# Patient Record
Sex: Female | Born: 1990
Health system: Southern US, Community
[De-identification: ages and names within clinical notes are randomized; demographics above are authoritative.]

## PROBLEM LIST (undated history)

## (undated) DIAGNOSIS — B009 Herpesviral infection, unspecified: Secondary | ICD-10-CM

## (undated) DIAGNOSIS — F419 Anxiety disorder, unspecified: Secondary | ICD-10-CM

## (undated) DIAGNOSIS — R87619 Unspecified abnormal cytological findings in specimens from cervix uteri: Secondary | ICD-10-CM

## (undated) DIAGNOSIS — D649 Anemia, unspecified: Secondary | ICD-10-CM

## (undated) DIAGNOSIS — S0990XA Unspecified injury of head, initial encounter: Secondary | ICD-10-CM

## (undated) DIAGNOSIS — S83209A Unspecified tear of unspecified meniscus, current injury, unspecified knee, initial encounter: Secondary | ICD-10-CM

## (undated) DIAGNOSIS — A64 Unspecified sexually transmitted disease: Secondary | ICD-10-CM

## (undated) HISTORY — DX: Unspecified injury of head, initial encounter: S09.90XA

## (undated) HISTORY — PX: CHOLECYSTECTOMY: SHX55

## (undated) HISTORY — DX: Herpesviral infection, unspecified: B00.9

## (undated) HISTORY — DX: Unspecified tear of unspecified meniscus, current injury, unspecified knee, initial encounter: S83.209A

## (undated) HISTORY — DX: Anxiety disorder, unspecified: F41.9

## (undated) HISTORY — DX: Anemia, unspecified: D64.9

## (undated) HISTORY — DX: Unspecified abnormal cytological findings in specimens from cervix uteri: R87.619

## (undated) HISTORY — DX: Unspecified sexually transmitted disease: A64

---
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
## 2016-05-25 HISTORY — PX: COLPOSCOPY: SHX161

## 2017-12-29 ENCOUNTER — Encounter: Payer: Self-pay | Admitting: Osteopathic Medicine

## 2017-12-29 ENCOUNTER — Ambulatory Visit (INDEPENDENT_AMBULATORY_CARE_PROVIDER_SITE_OTHER): Payer: 59 | Admitting: Osteopathic Medicine

## 2017-12-29 VITALS — BP 118/72 | HR 75 | Temp 98.4°F | Ht 64.0 in | Wt 217.2 lb

## 2017-12-29 DIAGNOSIS — F419 Anxiety disorder, unspecified: Secondary | ICD-10-CM

## 2017-12-29 DIAGNOSIS — Z113 Encounter for screening for infections with a predominantly sexual mode of transmission: Secondary | ICD-10-CM

## 2017-12-29 DIAGNOSIS — Z8619 Personal history of other infectious and parasitic diseases: Secondary | ICD-10-CM

## 2017-12-29 DIAGNOSIS — Z6837 Body mass index (BMI) 37.0-37.9, adult: Secondary | ICD-10-CM

## 2017-12-29 MED ORDER — PHENTERMINE HCL 37.5 MG PO TABS: 37.5000 mg | ORAL_TABLET | Freq: Every day | ORAL | 0 refills | Status: DC

## 2017-12-29 MED ORDER — ALPRAZOLAM 0.5 MG PO TABS: 0.2500 mg | ORAL_TABLET | Freq: Two times a day (BID) | ORAL | 0 refills | Status: DC | PRN

## 2017-12-29 NOTE — Progress Notes (Signed)
HPI: Theresa Wheeler is a 27 y.o. female who  has a past medical history of HSV-1 (herpes simplex virus 1) infection.  she presents to [facility] today, 12/30/17,  for chief complaint of: New to establish care Anxiety Obesity History of cold sores  Very pleasant new patient here to establish care.  Works as a Engineer, civil (consulting) in the [employer].  Reports significant anxiety episodes intermittently, may be a few times per week or a few times per month.  Typically revolve around possible exposure to blood and bodily fluids causing infection such as HIV or hepatitis.  She recognizes these are not particularly rational.  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  Previously on phentermine about a year ago for a few months.  Managed to lose about 20 to 30 pounds on this and keep it off, would like to repeat a round of this treatment.     Past medical, surgical, social and family history reviewed:  There are no active problems to display for this patient.   Past Surgical History:  Procedure Laterality Date  . BREAST REDUCTION SURGERY  2011  . CHOLECYSTECTOMY      Social History   Tobacco Use  . Smoking status: Never Smoker  . Smokeless tobacco: Never Used  Substance Use Topics  . Alcohol use: Yes    Comment: 3-4/week    Family History  Problem Relation Age of Onset  . Skin cancer Mother   . High blood pressure Father   . Diabetes Maternal Grandmother   . Stroke Maternal Grandmother   . Diabetes Maternal Grandfather   . Stroke Maternal Grandfather      Current medication list and allergy/intolerance information reviewed:    Current Outpatient Medications  Medication Sig Dispense Refill  . valACYclovir (VALTREX) 1000 MG tablet Take 1,000 mg by mouth daily.  9  .   30 tablet 0  .   30 tablet 0   No  current facility-administered medications for this visit.     No Known Allergies    Review of Systems:  Constitutional:  No  fever, no chills, No recent illness, No unintentional weight changes. No significant fatigue.   HEENT: No  headache, no vision change, no hearing change, No sore throat, No  sinus pressure  Cardiac: No  chest pain, No  pressure, No palpitations, No  Orthopnea  Respiratory:  No  shortness of breath. No  Cough  Gastrointestinal: No  abdominal pain, No  nausea, No  vomiting,  No  blood in stool, No  diarrhea, No  constipation   Musculoskeletal: No new myalgia/arthralgia  Skin: No  Rash, No other wounds/concerning lesions  Genitourinary: No  incontinence, No  abnormal genital bleeding, No abnormal genital discharge  Hem/Onc: No  easy bruising/bleeding, No  abnormal lymph node  Endocrine: No cold intolerance,  No heat intolerance. No polyuria/polydipsia/polyphagia   Neurologic: No  weakness, No  dizziness, No  slurred speech/focal weakness/facial droop  Psychiatric: No  concerns with depression, +concerns with anxiety, No sleep problems, No mood problems   Exam:  BP 118/72 (BP Location: Left Arm, Patient Position: Sitting, Cuff Size: Large)   Pulse 75   Temp 98.4 F (36.9 C) (Oral)   Ht 5\' 4"  (1.626 m)   Wt 217 lb 3.2 oz (98.5 kg)   LMP 12/19/2017   BMI 37.28 kg/m  Constitutional: VS see above. General Appearance: alert, well-developed, well-nourished, NAD  Eyes: Normal lids and conjunctive, non-icteric sclera  Ears, Nose, Mouth, Throat: MMM, Normal external inspection ears/nares/mouth/lips/gums. TM normal bilaterally. Pharynx/tonsils no erythema, no exudate. Nasal mucosa normal.   Neck: No masses, trachea midline. No thyroid enlargement. No tenderness/mass appreciated. No lymphadenopathy  Respiratory: Normal respiratory effort. no wheeze, no rhonchi, no rales  Cardiovascular: S1/S2 normal, no murmur, no rub/gallop auscultated. RRR. No lower  extremity edema.  Gastrointestinal: Nontender, no masses. No hepatomegaly, no splenomegaly.   Musculoskeletal: Gait normal. No clubbing/cyanosis of digits.   Neurological: Normal balance/coordination. No tremor. No cranial nerve deficit on limited exam. Motor and sensation intact and symmetric. Cerebellar reflexes intact.   Skin: warm, dry, intact. No rash/ulcer. No concerning nevi or subq nodules on limited exam.  Psychiatric: Normal judgment/insight. Normal mood and affect. Oriented x3.    Results for orders placed or performed in visit on 12/29/17 (from the past 72 hour(s))  CBC     Status: None   Collection Time: 12/29/17  3:08 PM  Result Value Ref Range   WBC 8.0 3.8 - 10.8 Thousand/uL   RBC 4.76 3.80 - 5.10 Million/uL   Hemoglobin 12.9 11.7 - 15.5 g/dL   HCT 09.838.5 11.935.0 - 14.745.0 %   MCV 80.9 80.0 - 100.0 fL   MCH 27.1 27.0 - 33.0 pg   MCHC 33.5 32.0 - 36.0 g/dL   RDW 82.913.0 56.211.0 - 13.015.0 %   Platelets 360 140 - 400 Thousand/uL   MPV 9.2 7.5 - 12.5 fL  COMPLETE METABOLIC PANEL WITH GFR     Status: None   Collection Time: 12/29/17  3:08 PM  Result Value Ref Range   Glucose, Bld 88 65 - 99 mg/dL    Comment: .            Fasting reference interval .    BUN 11 7 - 25 mg/dL   Creat 8.650.86 7.840.50 - 6.961.10 mg/dL   GFR, Est Non African American 93 > OR = 60 mL/min/1.1273m2   GFR, Est African American 108 > OR = 60 mL/min/1.10273m2   BUN/Creatinine Ratio NOT APPLICABLE 6 - 22 (calc)   Sodium 138 135 - 146 mmol/L   Potassium 4.2 3.5 - 5.3 mmol/L   Chloride 104 98 - 110 mmol/L   CO2 23 20 - 32 mmol/L   Calcium 9.6 8.6 - 10.2 mg/dL   Total Protein 7.8 6.1 - 8.1 g/dL   Albumin 4.6 3.6 - 5.1 g/dL   Globulin 3.2 1.9 - 3.7 g/dL (calc)   AG Ratio 1.4 1.0 - 2.5 (calc)   Total Bilirubin 0.5 0.2 - 1.2 mg/dL   Alkaline phosphatase (APISO) 64 33 - 115 U/L   AST 15 10 - 30 U/L   ALT 15 6 - 29 U/L  Lipid panel     Status: Abnormal   Collection Time: 12/29/17  3:08 PM  Result Value Ref Range    Cholesterol 216 (H) <200 mg/dL   HDL 56 >29>50 mg/dL   Triglycerides 528145 <413<150 mg/dL   LDL Cholesterol (Calc) 133 (H) mg/dL (calc)    Comment: Reference range: <100 . Desirable range <100 mg/dL for primary prevention;   <70 mg/dL for patients with CHD or diabetic patients  with > or = 2 CHD risk factors. Marland Kitchen. LDL-C is now calculated using the Martin-Hopkins  calculation, which is a validated novel method providing  better accuracy than the Friedewald equation in the  estimation of LDL-C.  Horald Pollen et al. Lenox Ahr. 1610;960(45): 2061-2068  (http://education.[facility].com/faq/FAQ164)    Total CHOL/HDL Ratio 3.9 <5.0 (calc)   Non-HDL Cholesterol (Calc) 160 (H) <130 mg/dL (calc)    Comment: For patients with diabetes plus 1 major ASCVD risk  factor, treating to a non-HDL-C goal of <100 mg/dL  (LDL-C of <40 mg/dL) is considered a therapeutic  option.   TSH     Status: None   Collection Time: 12/29/17  3:08 PM  Result Value Ref Range   TSH 2.71 mIU/L    Comment:           Reference Range .           > or = 20 Years  0.40-4.50 .                Pregnancy Ranges           First trimester    0.26-2.66           Second trimester   0.55-2.73           Third trimester    0.43-2.91   RPR     Status: None   Collection Time: 12/29/17  3:08 PM  Result Value Ref Range   RPR Ser Ql NON-REACTIVE NON-REACTI    No results found.   ASSESSMENT/PLAN: The primary encounter diagnosis was Routine screening for STI (sexually transmitted infection). Diagnoses of Anxiety, BMI 37.0-37.9, adult, and History of PCR DNA positive for HSV1 were also pertinent to this visit.   Okay to trial alprazolam as needed for sparing use.  Okay to prescribe 2 to 3 months of phentermine, as long as no concerning side effects.  Patient will follow-up for weight check.   Orders Placed This Encounter  Procedures  . CBC  . COMPLETE METABOLIC PANEL WITH GFR  . Lipid panel  . TSH  . HIV antibody  . Hepatitis B  surface antibody,qualitative  . Hepatitis B core antibody, total  . RPR  . Hepatitis C antibody   Meds ordered this encounter  Medications  . DISCONTD: ALPRAZolam (XANAX) 0.5 MG tablet    Sig: Take 0.5-1 tablets (0.25-0.5 mg total) by mouth 2 (two) times daily as needed for anxiety.    Dispense:  30 tablet    Refill:  0  . phentermine (ADIPEX-P) 37.5 MG tablet    Sig: Take 1 tablet (37.5 mg total) by mouth daily before breakfast.    Dispense:  30 tablet    Refill:  0  . ALPRAZolam (XANAX) 0.5 MG tablet    Sig: Take 0.5-1 tablets (0.25-0.5 mg total) by mouth 2 (two) times daily as needed for anxiety.    Dispense:  30 tablet    Refill:  0        Visit summary with medication list and pertinent instructions was printed for patient to review. All questions at time of visit were answered - patient instructed to contact office with any additional concerns. ER/RTC precautions were reviewed with the patient.   Follow-up plan: Return in about 1 month (around 01/26/2018) for weight check and BP check w nurse visit - sooner if needed .    Please note: voice recognition software was used to produce this document, and typos may escape review. Please contact Dr. Lyn Hollingshead for any needed clarifications.

## 2017-12-30 ENCOUNTER — Encounter: Payer: Self-pay | Admitting: Osteopathic Medicine

## 2017-12-30 DIAGNOSIS — Z8619 Personal history of other infectious and parasitic diseases: Secondary | ICD-10-CM | POA: Insufficient documentation

## 2017-12-30 DIAGNOSIS — Z6837 Body mass index (BMI) 37.0-37.9, adult: Secondary | ICD-10-CM | POA: Insufficient documentation

## 2017-12-30 DIAGNOSIS — F419 Anxiety disorder, unspecified: Secondary | ICD-10-CM | POA: Insufficient documentation

## 2017-12-30 LAB — COMPLETE METABOLIC PANEL WITH GFR
AG RATIO: 1.4 (calc) (ref 1.0–2.5)
ALT: 15 U/L (ref 6–29)
AST: 15 U/L (ref 10–30)
Albumin: 4.6 g/dL (ref 3.6–5.1)
Alkaline phosphatase (APISO): 64 U/L (ref 33–115)
BILIRUBIN TOTAL: 0.5 mg/dL (ref 0.2–1.2)
BUN: 11 mg/dL (ref 7–25)
CALCIUM: 9.6 mg/dL (ref 8.6–10.2)
CO2: 23 mmol/L (ref 20–32)
Chloride: 104 mmol/L (ref 98–110)
Creat: 0.86 mg/dL (ref 0.50–1.10)
GFR, Est African American: 108 mL/min/{1.73_m2} (ref >=60)
GFR, Est Non African American: 93 mL/min/{1.73_m2} (ref >=60)
Globulin: 3.2 g/dL (calc) (ref 1.9–3.7)
Glucose, Bld: 88 mg/dL (ref 65–99)
POTASSIUM: 4.2 mmol/L (ref 3.5–5.3)
Sodium: 138 mmol/L (ref 135–146)
Total Protein: 7.8 g/dL (ref 6.1–8.1)

## 2017-12-30 LAB — LIPID PANEL
Cholesterol: 216 mg/dL — ABNORMAL HIGH (ref <200)
HDL: 56 mg/dL (ref >50)
LDL Cholesterol (Calc): 133 mg/dL (calc) — ABNORMAL HIGH
Non-HDL Cholesterol (Calc): 160 mg/dL (calc) — ABNORMAL HIGH (ref <130)
TRIGLYCERIDES: 145 mg/dL (ref <150)
Total CHOL/HDL Ratio: 3.9 (calc) (ref <5.0)

## 2017-12-30 LAB — HEPATITIS B SURFACE ANTIBODY,QUALITATIVE: Hep B S Ab: REACTIVE — AB

## 2017-12-30 LAB — HEPATITIS B CORE ANTIBODY, TOTAL: HEP B C TOTAL AB: NONREACTIVE

## 2017-12-30 LAB — HEPATITIS C ANTIBODY
Hepatitis C Ab: NONREACTIVE
SIGNAL TO CUT-OFF: 0.03 (ref <1.00)

## 2017-12-30 LAB — CBC
HCT: 38.5 % (ref 35.0–45.0)
Hemoglobin: 12.9 g/dL (ref 11.7–15.5)
MCH: 27.1 pg (ref 27.0–33.0)
MCHC: 33.5 g/dL (ref 32.0–36.0)
MCV: 80.9 fL (ref 80.0–100.0)
MPV: 9.2 fL (ref 7.5–12.5)
PLATELETS: 360 10*3/uL (ref 140–400)
RBC: 4.76 10*6/uL (ref 3.80–5.10)
RDW: 13 % (ref 11.0–15.0)
WBC: 8 10*3/uL (ref 3.8–10.8)

## 2017-12-30 LAB — RPR: RPR: NONREACTIVE

## 2017-12-30 LAB — HIV ANTIBODY (ROUTINE TESTING W REFLEX): HIV 1&2 Ab, 4th Generation: NONREACTIVE

## 2017-12-30 LAB — TSH: TSH: 2.71 mIU/L

## 2018-02-01 ENCOUNTER — Ambulatory Visit (INDEPENDENT_AMBULATORY_CARE_PROVIDER_SITE_OTHER): Payer: 59 | Admitting: Osteopathic Medicine

## 2018-02-01 DIAGNOSIS — R635 Abnormal weight gain: Secondary | ICD-10-CM

## 2018-02-01 DIAGNOSIS — Z6837 Body mass index (BMI) 37.0-37.9, adult: Secondary | ICD-10-CM | POA: Diagnosis not present

## 2018-02-01 MED ORDER — PHENTERMINE HCL 37.5 MG PO TABS: 37.5000 mg | ORAL_TABLET | Freq: Every day | ORAL | 0 refills | Status: DC

## 2018-02-01 NOTE — Assessment & Plan Note (Signed)
Phentermine Rx 12/29/17 OK to refill today 02/01/18  Wt Readings from Last 3 Encounters:  02/01/18 196 lb (88.9 kg)  12/29/17 217 lb 3.2 oz (98.5 kg)

## 2018-02-01 NOTE — Progress Notes (Signed)
   Subjective:    Patient ID: Theresa Wheeler, female    DOB: 1991-05-22, 27 y.o.   MRN: [id number]  HPI  Theresa Wheeler is here for blood pressure and weight check. Diet and exercise is going well. Denies trouble sleeping or palpitations.   Review of Systems     Objective:   Physical Exam        Assessment & Plan:  Abnormal weight gain - Patient has lost weight. A refill of phentermine sent to pharmacy. Patient advised to schedule a follow up in 4 weeks.

## 2018-02-01 NOTE — Progress Notes (Signed)
Theresa Wheeler was seen today for weight check.  Diagnoses and all orders for this visit:  BMI 37.0-37.9, adult   Phentermine Rx 12/29/17 OK to refill today 02/01/18  Wt Readings from Last 3 Encounters:  02/01/18 196 lb (88.9 kg)  12/29/17 217 lb 3.2 oz (98.5 kg)

## 2018-02-11 ENCOUNTER — Telehealth: Payer: Self-pay

## 2018-02-11 DIAGNOSIS — Z8619 Personal history of other infectious and parasitic diseases: Secondary | ICD-10-CM

## 2018-02-11 NOTE — Telephone Encounter (Signed)
Requesting RF on Valacyclovir.   Pt states she usually gets this from her GYN but she has not been seen there recently and will need an appt for a refill. Does not appear this has ever been written by Dr Lyn HollingsheadAlexander before   I advised pt that Dr Lyn HollingsheadAlexander out of office and RF probably wont be sent in by Monday, but that I would send a phone note and see if she was okay to refill

## 2018-02-12 MED ORDER — VALACYCLOVIR HCL 1 G PO TABS: 1000.0000 mg | ORAL_TABLET | Freq: Every day | ORAL | 3 refills | Status: DC

## 2018-02-12 NOTE — Telephone Encounter (Signed)
I went ahead and refill the medication

## 2018-02-14 NOTE — Telephone Encounter (Signed)
Left VM for Pt with status update.  

## 2018-02-28 ENCOUNTER — Ambulatory Visit: Payer: 59

## 2018-05-04 ENCOUNTER — Telehealth: Payer: Self-pay

## 2018-05-04 NOTE — Telephone Encounter (Signed)
Pt called requesting med refill for xanax. If appropriate, pls send rx to [facility]. Thanks.

## 2018-05-05 ENCOUNTER — Ambulatory Visit (INDEPENDENT_AMBULATORY_CARE_PROVIDER_SITE_OTHER): Payer: 59 | Admitting: Osteopathic Medicine

## 2018-05-05 VITALS — BP 129/78 | HR 73 | Temp 97.4°F | Wt 183.5 lb

## 2018-05-05 DIAGNOSIS — Z6837 Body mass index (BMI) 37.0-37.9, adult: Secondary | ICD-10-CM

## 2018-05-05 MED ORDER — ALPRAZOLAM 0.5 MG PO TABS: 0.2500 mg | ORAL_TABLET | Freq: Two times a day (BID) | ORAL | 0 refills | Status: DC | PRN

## 2018-05-05 MED ORDER — PHENTERMINE HCL 37.5 MG PO TABS: 37.5000 mg | ORAL_TABLET | Freq: Every day | ORAL | 0 refills | Status: DC

## 2018-05-05 MED FILL — PHENTERMINE 37.5 MG TABLET: 37.5 | 30 days supply | Qty: 30 | Fill #0

## 2018-05-05 MED FILL — ALPRAZolam 0.5 MG TABS: 0.5 | 15 days supply | Qty: 30 | Fill #0

## 2018-05-05 NOTE — Telephone Encounter (Signed)
Already sent.

## 2018-05-05 NOTE — Progress Notes (Signed)
Pt here today for weight and BP check. Previous weight was 196 lb, todays weight 183.5 lb. Her BP was 129/78. Pt is requesting we switch her pharmacy to [facility], she also needs Xanax refilled if possible.

## 2018-05-06 NOTE — Telephone Encounter (Signed)
Left a detailed vm msg for pt regarding med refill. Call back info provided.  

## 2018-05-27 ENCOUNTER — Telehealth: Payer: Self-pay

## 2018-05-27 MED ORDER — FLUCONAZOLE 150 MG PO TABS: 150.0000 mg | ORAL_TABLET | Freq: Once | ORAL | 0 refills | Status: AC

## 2018-05-27 NOTE — Telephone Encounter (Signed)
Theresa Wheeler complains of white vaginal discharge and vaginal itching. Denies fever, chills, sweats or pelvic pain. She has tried OTC medications without relief. She states she needs 2 diflucan. Please advise.

## 2018-05-27 NOTE — Telephone Encounter (Signed)
Sent to the pharmacy. If not improving please see PcP for office visit.

## 2018-05-30 NOTE — Telephone Encounter (Signed)
Left VM with update.  

## 2018-06-16 MED FILL — valACYclovir HCL 1 GM TABS: 1 | 90 days supply | Qty: 90 | Fill #0

## 2018-06-30 ENCOUNTER — Other Ambulatory Visit: Payer: Self-pay

## 2018-06-30 ENCOUNTER — Emergency Department (INDEPENDENT_AMBULATORY_CARE_PROVIDER_SITE_OTHER): Payer: 59

## 2018-06-30 ENCOUNTER — Emergency Department
Admission: EM | Admit: 2018-06-30 | Discharge: 2018-06-30 | Disposition: A | Discharge status: Home / Self Care | Payer: 59 | Source: Home / Self Care | Attending: Family Medicine | Admitting: Family Medicine

## 2018-06-30 DIAGNOSIS — S20211A Contusion of right front wall of thorax, initial encounter: Secondary | ICD-10-CM

## 2018-06-30 DIAGNOSIS — Z3200 Encounter for pregnancy test, result unknown: Secondary | ICD-10-CM | POA: Diagnosis not present

## 2018-06-30 DIAGNOSIS — M542 Cervicalgia: Secondary | ICD-10-CM | POA: Diagnosis not present

## 2018-06-30 DIAGNOSIS — S20312A Abrasion of left front wall of thorax, initial encounter: Secondary | ICD-10-CM

## 2018-06-30 DIAGNOSIS — S161XXA Strain of muscle, fascia and tendon at neck level, initial encounter: Secondary | ICD-10-CM

## 2018-06-30 DIAGNOSIS — S299XXA Unspecified injury of thorax, initial encounter: Secondary | ICD-10-CM | POA: Diagnosis not present

## 2018-06-30 DIAGNOSIS — R072 Precordial pain: Secondary | ICD-10-CM | POA: Diagnosis not present

## 2018-06-30 DIAGNOSIS — R079 Chest pain, unspecified: Secondary | ICD-10-CM

## 2018-06-30 DIAGNOSIS — S199XXA Unspecified injury of neck, initial encounter: Secondary | ICD-10-CM | POA: Diagnosis not present

## 2018-06-30 LAB — POCT URINALYSIS DIP (MANUAL ENTRY)
Bilirubin, UA: NEGATIVE
Blood, UA: NEGATIVE
Glucose, UA: NEGATIVE mg/dL
Ketones, POC UA: NEGATIVE mg/dL
Leukocytes, UA: NEGATIVE
Nitrite, UA: NEGATIVE
PROTEIN UA: NEGATIVE mg/dL
Spec Grav, UA: 1.01 (ref 1.010–1.025)
Urobilinogen, UA: 0.2 E.U./dL
pH, UA: 5.5 (ref 5.0–8.0)

## 2018-06-30 LAB — POCT URINE PREGNANCY: Preg Test, Ur: NEGATIVE

## 2018-06-30 MED ORDER — CYCLOBENZAPRINE HCL 10 MG PO TABS: 10.0000 mg | ORAL_TABLET | Freq: Three times a day (TID) | ORAL | 1 refills | Status: DC | PRN

## 2018-06-30 MED ORDER — HYDROCODONE-ACETAMINOPHEN 5-325 MG PO TABS: ORAL_TABLET | ORAL | 0 refills | Status: DC

## 2018-06-30 MED ORDER — MUPIROCIN 2 % EX OINT: 1.0000 "application " | TOPICAL_OINTMENT | Freq: Three times a day (TID) | CUTANEOUS | 0 refills | Status: DC

## 2018-06-30 NOTE — Discharge Instructions (Signed)
Apply ice pack for 20 to 30 minutes, 3 to 4 times daily  Continue until pain and swelling decrease.  Continue Ibuprofen 200mg , 4 tabs every 8 hours with food.   May take acetaminophen 2 to 3 times daily as needed.  Begin range of motion and stretching exercises in about 5 days as tolerated. May return for cervical collar if neck pain worsens.
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
## 2018-07-03 ENCOUNTER — Telehealth: Payer: Self-pay | Admitting: Emergency Medicine

## 2018-07-03 NOTE — Telephone Encounter (Signed)
Spoke with patient states that she is doing well.  Will follow up as needed.

## 2018-08-18 ENCOUNTER — Encounter: Payer: Self-pay | Admitting: Osteopathic Medicine

## 2018-08-18 ENCOUNTER — Other Ambulatory Visit: Payer: Self-pay

## 2018-08-18 ENCOUNTER — Ambulatory Visit (INDEPENDENT_AMBULATORY_CARE_PROVIDER_SITE_OTHER): Payer: 59 | Admitting: Osteopathic Medicine

## 2018-08-18 VITALS — BP 120/74 | HR 98 | Temp 100.0°F | Wt 182.0 lb

## 2018-08-18 DIAGNOSIS — R509 Fever, unspecified: Secondary | ICD-10-CM

## 2018-08-18 MED ORDER — ALPRAZOLAM 0.5 MG PO TABS: 0.2500 mg | ORAL_TABLET | Freq: Two times a day (BID) | ORAL | 0 refills | Status: DC | PRN

## 2018-08-18 MED ORDER — AMOXICILLIN 500 MG PO TABS: 500.0000 mg | ORAL_TABLET | Freq: Two times a day (BID) | ORAL | 0 refills | Status: AC

## 2018-08-18 NOTE — Patient Instructions (Addendum)
Medications & Home Remedies for Upper Respiratory Illness   Aches/Pains, Fever, Headache OTC Acetaminophen (Tylenol) 500 mg tablets - take max 2 tablets (1000 mg) every 6 hours (4 times per day)    Sinus Congestion OTC Nasal Saline if desired to rinse OTC Oxymetolazone (Afrin, others) sparing use due to rebound congestion, NEVER use in kids OTC Phenylephrine (Sudafed) 10 mg tablets every 4 hours (or the 12-hour formulation)* OTC Diphenhydramine (Benadryl) 25 mg tablets - take max 2 tablets every 4 hours   Cough & Sore Throat OTC Dextromethorphan (Robitussin, others) - cough suppressant OTC Guaifenesin (Robitussin, Mucinex, others) - expectorant (helps cough up mucus) (Dextromethorphan and Guaifenesin also come in a combination tablet/syrup) OTC Lozenges w/ Benzocaine + Menthol (Cepacol) Honey - as much as you want! Teas which "coat the throat" - look for ingredients Elm Bark, Licorice Root, Marshmallow Root   Other OTC Zinc Lozenges within 24 hours of symptoms onset - mixed evidence this shortens the duration of the common cold Don't waste your money on Vitamin C or Echinacea in acute illness - it's already too late!         [name]  Location: [address] [place] [place] [address]   Infection Prevention Recommendations for Individuals Confirmed to have, or Being Evaluated for, 2019 Novel Coronavirus (COVID-19) Infection Who Receive Care at Home  Individuals who are confirmed to have, or are being evaluated for, COVID-19 should follow the prevention steps below until a healthcare provider or local or state health department says they can return to normal activities.  Stay home except to get medical care You should restrict activities outside your home, except for getting medical care. Do not go to work, school, or public areas, and do not use public transportation or taxis.  Call ahead before visiting your  doctor Before your medical appointment, call the healthcare provider and tell them that you have, or are being evaluated for, COVID-19 infection. This will help the healthcare provider's office take steps to keep other people from getting infected. Ask your healthcare provider to call the local or state health department.  Monitor your symptoms Seek prompt medical attention if your illness is worsening (e.g., difficulty breathing). Before going to your medical appointment, call the healthcare provider and tell them that you have, or are being evaluated for, COVID-19 infection. Ask your healthcare provider to call the local or state health department.  Wear a facemask You should wear a facemask that covers your nose and mouth when you are in the same room with other people and when you visit a healthcare provider. People who live with or visit you should also wear a facemask while they are in the same room with you.  Separate yourself from other people in your home As much as possible, you should stay in a different room from other people in your home. Also, you should use a separate bathroom, if available.  Avoid sharing household items You should not share dishes, drinking glasses, cups, eating utensils, towels, bedding, or other items with other people in your home. After using these items, you should wash them thoroughly with soap and water.  Cover your coughs and sneezes Cover your mouth and nose with a tissue when you cough or sneeze, or you can cough or sneeze into your sleeve. Throw used tissues in a lined trash can, and immediately wash your hands with soap and water for at least 20 seconds or  use an alcohol-based hand rub.  Wash your [employer] your hands often and thoroughly with soap and water for at least 20 seconds. You can use an alcohol-based hand sanitizer if soap and water are not available and if your hands are not visibly dirty. Avoid touching your eyes, nose,  and mouth with unwashed hands.   Prevention Steps for Caregivers and Household Members of Individuals Confirmed to have, or Being Evaluated for, COVID-19 Infection Being Cared for in the Home  If you live with, or provide care at home for, a person confirmed to have, or being evaluated for, COVID-19 infection please follow these guidelines to prevent infection:  Follow healthcare provider's instructions Make sure that you understand and can help the patient follow any healthcare provider instructions for all care.  Provide for the patient's basic needs You should help the patient with basic needs in the home and provide support for getting groceries, prescriptions, and other personal needs.  Monitor the patient's symptoms If they are getting sicker, call his or her medical provider and tell them that the patient has, or is being evaluated for, COVID-19 infection. This will help the healthcare provider's office take steps to keep other people from getting infected. Ask the healthcare provider to call the local or state health department.  Limit the number of people who have contact with the patient  If possible, have only one caregiver for the patient.  Other household members should stay in another home or place of residence. If this is not possible, they should stay  in another room, or be separated from the patient as much as possible. Use a separate bathroom, if available.  Restrict visitors who do not have an essential need to be in the home.  Keep older adults, very young children, and other sick people away from the patient Keep older adults, very young children, and those who have compromised immune systems or chronic health conditions away from the patient. This includes people with chronic heart, lung, or kidney conditions, diabetes, and cancer.  Ensure good ventilation Make sure that shared spaces in the home have good air flow, such as from an air conditioner or an  opened window, weather permitting.  Wash your hands often  Wash your hands often and thoroughly with soap and water for at least 20 seconds. You can use an alcohol based hand sanitizer if soap and water are not available and if your hands are not visibly dirty.  Avoid touching your eyes, nose, and mouth with unwashed hands.  Use disposable paper towels to dry your hands. If not available, use dedicated cloth towels and replace them when they become wet.  Wear a facemask and gloves  Wear a disposable facemask at all times in the room and gloves when you touch or have contact with the patient's blood, body fluids, and/or secretions or excretions, such as sweat, saliva, sputum, nasal mucus, vomit, urine, or feces.  Ensure the mask fits over your nose and mouth tightly, and do not touch it during use.  Throw out disposable facemasks and gloves after using them. Do not reuse.  Wash your hands immediately after removing your facemask and gloves.  If your personal clothing becomes contaminated, carefully remove clothing and launder. Wash your hands after handling contaminated clothing.  Place all used disposable facemasks, gloves, and other waste in a lined container before disposing them with other household waste.  Remove gloves and wash your hands immediately after handling these items.  Do not share  dishes, glasses, or other household items with the patient  Avoid sharing household items. You should not share dishes, drinking glasses, cups, eating utensils, towels, bedding, or other items with a patient who is confirmed to have, or being evaluated for, COVID-19 infection.  After the person uses these items, you should wash them thoroughly with soap and water.  Wash laundry thoroughly  Immediately remove and wash clothes or bedding that have blood, body fluids, and/or secretions or excretions, such as sweat, saliva, sputum, nasal mucus, vomit, urine, or feces, on them.  Wear gloves  when handling laundry from the patient.  Read and follow directions on labels of laundry or clothing items and detergent. In general, wash and dry with the warmest temperatures recommended on the label.  Clean all areas the individual has used often  Clean all touchable surfaces, such as counters, tabletops, doorknobs, bathroom fixtures, toilets, phones, keyboards, tablets, and bedside tables, every day. Also, clean any surfaces that may have blood, body fluids, and/or secretions or excretions on them.  Wear gloves when cleaning surfaces the patient has come in contact with.  Use a diluted bleach solution (e.g., dilute bleach with 1 part bleach and 10 parts water) or a household disinfectant with a label that says EPA-registered for coronaviruses. To make a bleach solution at home, add 1 tablespoon of bleach to 1 quart (4 cups) of water. For a larger supply, add  cup of bleach to 1 gallon (16 cups) of water.  Read labels of cleaning products and follow recommendations provided on product labels. Labels contain instructions for safe and effective use of the cleaning product including precautions you should take when applying the product, such as wearing gloves or eye protection and making sure you have good ventilation during use of the product.  Remove gloves and wash hands immediately after cleaning.  Monitor yourself for signs and symptoms of illness Caregivers and household members are considered close contacts, should monitor their health, and will be asked to limit movement outside of the home to the extent possible. Follow the monitoring steps for close contacts listed on the symptom monitoring form.   ? If you have additional questions, contact your local health department or call the epidemiologist on call at [phone] (available 24/7). ? This guidance is subject to change. For the most up-to-date guidance from [facility], please refer to their  website: [email or web address]

## 2018-08-18 NOTE — Progress Notes (Signed)
Virtual Visit  via Video or Phone Note  I connected with      Theresa Wheeler on 08/18/18 at 3:00 by a telemedicine application and verified that I am speaking with the correct person using two identifiers.   I discussed the limitations of evaluation and management by telemedicine and the availability of in person appointments. The patient expressed understanding and agreed to proceed.  History of Present Illness: Theresa Wheeler is a 28 y.o. female who would like to discuss fever.   Onset 2 days ago, was sent home from work yesterday. Took Tylenol 1000 mg when temp as high as 104  Was 101.9 when got home from work, this was 5 hours from last Tylenol. Mild scratchy/sore throat but not severe, feels more dry than painful. She has no lymphadenopathy. No cough. No sinus pressure. No recent travel. 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       Observations/Objective: BP 120/74 (Patient Position: Sitting)   Pulse 98   Temp 100 F (37.8 C) (Oral)   Wt 182 lb (82.6 kg)   BMI 31.24 kg/m  BP Readings from Last 3 Encounters:  08/18/18 120/74  06/30/18 131/86  05/05/18 129/78   Exam: Normal Speech. Normal affect.    Lab and Radiology Results No results found for this or any previous visit (from the past 72 hour(s)). No results found.     Assessment and Plan: 28 y.o. female with The encounter diagnosis was Febrile illness, acute.  Advised self-quarantine according to CDC guidelines. Check CDC website for full details.   PDMP not reviewed this encounter. No orders of the defined types were placed in this encounter.  Meds ordered this encounter  Medications  . amoxicillin (AMOXIL) 500 MG tablet    Sig: Take 1 tablet (500 mg total) by mouth 2 (two) times daily for 10 days. If needed for strep throat    Dispense:  20 tablet    Refill:  0  . ALPRAZolam (XANAX) 0.5 MG tablet    Sig: Take 0.5-1 tablets (0.25-0.5 mg total) by mouth 2 (two) times  daily as needed for anxiety.    Dispense:  30 tablet    Refill:  0   Patient Instructions     Medications & Home Remedies for Upper Respiratory Illness   Aches/Pains, Fever, Headache OTC Acetaminophen (Tylenol) 500 mg tablets - take max 2 tablets (1000 mg) every 6 hours (4 times per day)    Sinus Congestion OTC Nasal Saline if desired to rinse OTC Oxymetolazone (Afrin, others) sparing use due to rebound congestion, NEVER use in kids OTC Phenylephrine (Sudafed) 10 mg tablets every 4 hours (or the 12-hour formulation)* OTC Diphenhydramine (Benadryl) 25 mg tablets - take max 2 tablets every 4 hours   Cough & Sore Throat OTC Dextromethorphan (Robitussin, others) - cough suppressant OTC Guaifenesin (Robitussin, Mucinex, others) - expectorant (helps cough up mucus) (Dextromethorphan and Guaifenesin also come in a combination tablet/syrup) OTC Lozenges w/ Benzocaine + Menthol (Cepacol) Honey - as much as you want! Teas which "coat the throat" - look for ingredients Elm Bark, Licorice Root, Marshmallow Root   Other OTC Zinc Lozenges within 24 hours of symptoms onset - mixed evidence this shortens the duration of the common cold Don't waste your money on Vitamin C or Echinacea in acute illness - it's already too late!         [name]  Location: [facility]  [address] [place] [address]   Infection Prevention Recommendations for Individuals Confirmed to have, or Being Evaluated for, 2019 Novel Coronavirus (COVID-19) Infection Who Receive Care at Home  Individuals who are confirmed to have, or are being evaluated for, COVID-19 should follow the prevention steps below until a healthcare provider or local or state health department says they can return to normal activities.  Stay home except to get medical care You should restrict activities outside your home, except for getting medical care. Do not go to work, school, or public  areas, and do not use public transportation or taxis.  Call ahead before visiting your doctor Before your medical appointment, call the healthcare provider and tell them that you have, or are being evaluated for, COVID-19 infection. This will help the healthcare provider's office take steps to keep other people from getting infected. Ask your healthcare provider to call the local or state health department.  Monitor your symptoms Seek prompt medical attention if your illness is worsening (e.g., difficulty breathing). Before going to your medical appointment, call the healthcare provider and tell them that you have, or are being evaluated for, COVID-19 infection. Ask your healthcare provider to call the local or state health department.  Wear a facemask You should wear a facemask that covers your nose and mouth when you are in the same room with other people and when you visit a healthcare provider. People who live with or visit you should also wear a facemask while they are in the same room with you.  Separate yourself from other people in your home As much as possible, you should stay in a different room from other people in your home. Also, you should use a separate bathroom, if available.  Avoid sharing household items You should not share dishes, drinking glasses, cups, eating utensils, towels, bedding, or other items with other people in your home. After using these items, you should wash them thoroughly with soap and water.  Cover your coughs and sneezes Cover your mouth and nose with a tissue when you cough or sneeze, or you can cough or sneeze into your sleeve. Throw used tissues in a lined trash can, and immediately wash your hands with soap and water for at least 20 seconds or use an alcohol-based hand rub.  Wash your [employer] your hands often and thoroughly with soap and water for at least 20 seconds. You can use an alcohol-based hand sanitizer if soap and water are not  available and if your hands are not visibly dirty. Avoid touching your eyes, nose, and mouth with unwashed hands.   Prevention Steps for Caregivers and Household Members of Individuals Confirmed to have, or Being Evaluated for, COVID-19 Infection Being Cared for in the Home  If you live with, or provide care at home for, a person confirmed to have, or being evaluated for, COVID-19 infection please follow these guidelines to prevent infection:  Follow healthcare provider's instructions Make sure that you understand and can help the patient follow any healthcare provider instructions for all care.  Provide for the patient's basic needs You should help the patient with basic needs in the home and provide support for getting groceries, prescriptions, and other personal needs.  Monitor the patient's symptoms If they are getting sicker, call his or her medical provider and tell them that the patient has, or is being evaluated for, COVID-19 infection. This will help the healthcare provider's office take steps to keep other people from getting  infected. Ask the healthcare provider to call the local or state health department.  Limit the number of people who have contact with the patient  If possible, have only one caregiver for the patient.  Other household members should stay in another home or place of residence. If this is not possible, they should stay  in another room, or be separated from the patient as much as possible. Use a separate bathroom, if available.  Restrict visitors who do not have an essential need to be in the home.  Keep older adults, very young children, and other sick people away from the patient Keep older adults, very young children, and those who have compromised immune systems or chronic health conditions away from the patient. This includes people with chronic heart, lung, or kidney conditions, diabetes, and cancer.  Ensure good ventilation Make sure that  shared spaces in the home have good air flow, such as from an air conditioner or an opened window, weather permitting.  Wash your hands often  Wash your hands often and thoroughly with soap and water for at least 20 seconds. You can use an alcohol based hand sanitizer if soap and water are not available and if your hands are not visibly dirty.  Avoid touching your eyes, nose, and mouth with unwashed hands.  Use disposable paper towels to dry your hands. If not available, use dedicated cloth towels and replace them when they become wet.  Wear a facemask and gloves  Wear a disposable facemask at all times in the room and gloves when you touch or have contact with the patient's blood, body fluids, and/or secretions or excretions, such as sweat, saliva, sputum, nasal mucus, vomit, urine, or feces.  Ensure the mask fits over your nose and mouth tightly, and do not touch it during use.  Throw out disposable facemasks and gloves after using them. Do not reuse.  Wash your hands immediately after removing your facemask and gloves.  If your personal clothing becomes contaminated, carefully remove clothing and launder. Wash your hands after handling contaminated clothing.  Place all used disposable facemasks, gloves, and other waste in a lined container before disposing them with other household waste.  Remove gloves and wash your hands immediately after handling these items.  Do not share dishes, glasses, or other household items with the patient  Avoid sharing household items. You should not share dishes, drinking glasses, cups, eating utensils, towels, bedding, or other items with a patient who is confirmed to have, or being evaluated for, COVID-19 infection.  After the person uses these items, you should wash them thoroughly with soap and water.  Wash laundry thoroughly  Immediately remove and wash clothes or bedding that have blood, body fluids, and/or secretions or excretions, such as  sweat, saliva, sputum, nasal mucus, vomit, urine, or feces, on them.  Wear gloves when handling laundry from the patient.  Read and follow directions on labels of laundry or clothing items and detergent. In general, wash and dry with the warmest temperatures recommended on the label.  Clean all areas the individual has used often  Clean all touchable surfaces, such as counters, tabletops, doorknobs, bathroom fixtures, toilets, phones, keyboards, tablets, and bedside tables, every day. Also, clean any surfaces that may have blood, body fluids, and/or secretions or excretions on them.  Wear gloves when cleaning surfaces the patient has come in contact with.  Use a diluted bleach solution (e.g., dilute bleach with 1 part bleach and 10 parts water) or a household  disinfectant with a label that says EPA-registered for coronaviruses. To make a bleach solution at home, add 1 tablespoon of bleach to 1 quart (4 cups) of water. For a larger supply, add  cup of bleach to 1 gallon (16 cups) of water.  Read labels of cleaning products and follow recommendations provided on product labels. Labels contain instructions for safe and effective use of the cleaning product including precautions you should take when applying the product, such as wearing gloves or eye protection and making sure you have good ventilation during use of the product.  Remove gloves and wash hands immediately after cleaning.  Monitor yourself for signs and symptoms of illness Caregivers and household members are considered close contacts, should monitor their health, and will be asked to limit movement outside of the home to the extent possible. Follow the monitoring steps for close contacts listed on the symptom monitoring form.   ? If you have additional questions, contact your local health department or call the epidemiologist on call at [phone] (available 24/7). ? This guidance is subject to change. For the most up-to-date  guidance from [facility], please refer to their website: TripMetro.huhttps://www.cdc.gov/coronavirus/2019-ncov/hcp/guidance-prevent-spread.html    Instructions sent via MyChart. If MyChart not available, pt was given option for info via personal e-mail w/ no guarantee of protected health info over unsecured e-mail communication, and MyChart sign-up instructions were included.   Follow Up Instructions: No follow-ups on file.    I discussed the assessment and treatment plan with the patient. The patient was provided an opportunity to ask questions and all were answered. The patient agreed with the plan and demonstrated an understanding of the instructions.   The patient was advised to call back or seek an in-person evaluation if the symptoms worsen or if the condition fails to improve as anticipated.  I provided 15 minutes of non-face-to-face time during this encounter.                      Historical information moved to improve visibility of documentation.  Past Medical History:  Diagnosis Date  . HSV-1 (herpes simplex virus 1) infection    Past Surgical History:  Procedure Laterality Date  . BREAST REDUCTION SURGERY  2011  . CHOLECYSTECTOMY     Social History   Tobacco Use  . Smoking status: Never Smoker  . Smokeless tobacco: Never Used  Substance Use Topics  . Alcohol use: Yes    Comment: 3-4/week   family history includes Diabetes in her maternal grandfather and maternal grandmother; High blood pressure in her father; Skin cancer in her mother; Stroke in her maternal grandfather and maternal grandmother.  Medications: Current Outpatient Medications  Medication Sig Dispense Refill  . ALPRAZolam (XANAX) 0.5 MG tablet Take 0.5-1 tablets (0.25-0.5 mg total) by mouth 2 (two) times daily as needed for anxiety. 30 tablet 0  . phentermine (ADIPEX-P) 37.5 MG tablet Take 1 tablet (37.5 mg total) by mouth daily before breakfast. 30 tablet 0  . valACYclovir (VALTREX) 1000 MG tablet  Take 1 tablet (1,000 mg total) by mouth daily. 90 tablet 3  . amoxicillin (AMOXIL) 500 MG tablet Take 1 tablet (500 mg total) by mouth 2 (two) times daily for 10 days. If needed for strep throat 20 tablet 0  . cyclobenzaprine (FLEXERIL) 10 MG tablet Take 1 tablet (10 mg total) by mouth 3 (three) times daily as needed for muscle spasms. (Patient not taking: Reported on 08/18/2018) 20 tablet 1  . HYDROcodone-acetaminophen (NORCO/VICODIN) 5-325 MG  tablet Take one by mouth at bedtime as needed for pain.  May repeat 4 to 6hr later prn. (Patient not taking: Reported on 08/18/2018) 10 tablet 0  . mupirocin ointment (BACTROBAN) 2 % Apply 1 application topically 3 (three) times daily. (Patient not taking: Reported on 08/18/2018) 22 g 0   No current facility-administered medications for this visit.    No Known Allergies  PDMP not reviewed this encounter. No orders of the defined types were placed in this encounter.  Meds ordered this encounter  Medications  . amoxicillin (AMOXIL) 500 MG tablet    Sig: Take 1 tablet (500 mg total) by mouth 2 (two) times daily for 10 days. If needed for strep throat    Dispense:  20 tablet    Refill:  0  . ALPRAZolam (XANAX) 0.5 MG tablet    Sig: Take 0.5-1 tablets (0.25-0.5 mg total) by mouth 2 (two) times daily as needed for anxiety.    Dispense:  30 tablet    Refill:  0

## 2018-08-22 MED FILL — ALPRAZolam 0.5 MG TABS: 0.5 | 15 days supply | Qty: 30 | Fill #0

## 2018-08-22 MED FILL — AMOXICILLIN 500 MG CAPSULE: 500 | 10 days supply | Qty: 20 | Fill #0

## 2018-09-08 MED FILL — valACYclovir HCL 1 GM TABS: 1 | 90 days supply | Qty: 90 | Fill #0

## 2018-10-31 ENCOUNTER — Telehealth: Payer: Self-pay

## 2018-10-31 DIAGNOSIS — Z8619 Personal history of other infectious and parasitic diseases: Secondary | ICD-10-CM

## 2018-10-31 NOTE — Telephone Encounter (Signed)
Labs ordered, can come in to lab downstairs anytime

## 2018-10-31 NOTE — Telephone Encounter (Signed)
Pt advised.

## 2018-10-31 NOTE — Telephone Encounter (Signed)
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 Patient wants to know if Dr Sheppard Coil can order the antibody test for her, just to see if she had it

## 2018-11-01 DIAGNOSIS — Z8619 Personal history of other infectious and parasitic diseases: Secondary | ICD-10-CM | POA: Diagnosis not present

## 2018-11-01 LAB — SAR COV2 SEROLOGY (COVID19)AB(IGG),IA: SARS CoV2 AB IGG: NEGATIVE

## 2018-11-03 ENCOUNTER — Encounter: Payer: Self-pay | Admitting: Obstetrics & Gynecology

## 2018-11-19 ENCOUNTER — Other Ambulatory Visit: Payer: Self-pay | Admitting: Osteopathic Medicine

## 2018-11-21 MED ORDER — ALPRAZOLAM 0.5 MG PO TABS: 0.2500 mg | ORAL_TABLET | Freq: Two times a day (BID) | ORAL | 0 refills | Status: DC | PRN

## 2018-11-21 MED FILL — ALPRAZolam 0.5 MG TABS: 0.5 | 15 days supply | Qty: 30 | Fill #0

## 2018-11-21 NOTE — Telephone Encounter (Signed)
Please advise 

## 2018-11-24 ENCOUNTER — Encounter: Payer: 59 | Admitting: Obstetrics & Gynecology

## 2018-12-13 MED FILL — valACYclovir HCL 1 GM TABS: 1 | 90 days supply | Qty: 90 | Fill #0

## 2019-01-05 DIAGNOSIS — Z111 Encounter for screening for respiratory tuberculosis: Secondary | ICD-10-CM | POA: Diagnosis not present

## 2019-01-16 ENCOUNTER — Telehealth: Payer: Self-pay

## 2019-01-16 MED ORDER — CYCLOBENZAPRINE HCL 10 MG PO TABS: 10.0000 mg | ORAL_TABLET | Freq: Three times a day (TID) | ORAL | 1 refills | Status: DC | PRN

## 2019-01-16 NOTE — Telephone Encounter (Signed)
Theresa Wheeler called and states she is having low left back spasms. She states she has used Flexeril in the past and has helped. She states she is not able to come in due to having to work 12 hours. 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 She might be able to do a telephone visit today. Please advise.

## 2019-01-16 NOTE — Telephone Encounter (Signed)
Refill Flexeril to [facility] on file, if this is not helpful, patient will need to arrange virtual visit.  Thanks!

## 2019-01-17 NOTE — Telephone Encounter (Signed)
Left message on patient vm advising that Flexril has been sent to [facility]. Mayco Walrond,CMA

## 2019-02-12 ENCOUNTER — Emergency Department
Admission: EM | Admit: 2019-02-12 | Discharge: 2019-02-12 | Disposition: A | Discharge status: Home / Self Care | Payer: 59 | Source: Home / Self Care

## 2019-02-12 ENCOUNTER — Encounter: Payer: Self-pay | Admitting: Emergency Medicine

## 2019-02-12 ENCOUNTER — Other Ambulatory Visit: Payer: Self-pay

## 2019-02-12 DIAGNOSIS — Z20828 Contact with and (suspected) exposure to other viral communicable diseases: Secondary | ICD-10-CM

## 2019-02-12 DIAGNOSIS — N764 Abscess of vulva: Secondary | ICD-10-CM

## 2019-02-12 DIAGNOSIS — J029 Acute pharyngitis, unspecified: Secondary | ICD-10-CM

## 2019-02-12 DIAGNOSIS — Z7689 Persons encountering health services in other specified circumstances: Secondary | ICD-10-CM | POA: Diagnosis not present

## 2019-02-12 DIAGNOSIS — J039 Acute tonsillitis, unspecified: Secondary | ICD-10-CM

## 2019-02-12 DIAGNOSIS — Z20822 Contact with and (suspected) exposure to covid-19: Secondary | ICD-10-CM

## 2019-02-12 LAB — POCT RAPID STREP A (OFFICE): Rapid Strep A Screen: NEGATIVE

## 2019-02-12 MED ORDER — CLINDAMYCIN HCL 300 MG PO CAPS: 300.0000 mg | ORAL_CAPSULE | Freq: Three times a day (TID) | ORAL | 0 refills | Status: AC

## 2019-02-12 MED ORDER — FLUCONAZOLE 150 MG PO TABS: 150.0000 mg | ORAL_TABLET | Freq: Once | ORAL | 0 refills | Status: AC

## 2019-02-12 MED ORDER — PREDNISONE 50 MG PO TABS: 50.0000 mg | ORAL_TABLET | Freq: Every day | ORAL | 0 refills | Status: AC

## 2019-02-12 NOTE — ED Triage Notes (Signed)
Patient c/o of sore throat and fever, fever today 101.8 taken Tylenol and Ibuprofen, white spots in throat.

## 2019-02-12 NOTE — Discharge Instructions (Signed)
°  Please take antibiotics as prescribed and be sure to complete entire course even if you start to feel better to ensure infection does not come back.  You will be notified of the strep culture results in about 2-3 days.  Due to your exam being concerning for strep as well as having the skin infection, please start your antibiotic today.   Due to concern for possibly having Covid-19, it is advised that you self-isolate at home until test results come back (usually 2-3 days).  If positive, it is recommended you stay isolated for at least 7 days after symptom onset and 24 hours after last fever without taking medication (whichever is longer).  If you MUST go out, please wear a mask at all times, limit contact with others.   Do mention to [employer] [facility] [employer] you are awaiting Covid results when it is time for you to fill out your daily Covid survey.

## 2019-02-12 NOTE — ED Provider Notes (Signed)
[facility]    CSN: [id number] Arrival date & time: 02/12/19  1123      History   Chief Complaint Chief Complaint  Patient presents with  . Sore Throat    HPI Theresa Wheeler is a 29 y.o. female.   HPI Theresa Wheeler is a 28 y.o. female presenting to UC with c/o gradually worsening sore throat that started 2 days ago.  She has been alternating Tylenol and ibuprofen the last few days but decided to check her temperature this morning before taking her medication, her temp was 101.8*F.  She is concerned she may have strep throat as she feels otherwise well, no body aches, cough, congestion, n/v/d. She does work for [employer] and notes she told [facility] she was exposed to a Covid-19 positive pt last week who was wearing a non-rebreather face mask. Pt was in just a surgical mask and glasses at the time because she did not know the pt was Covid positive.  Pt was advised she had sufficient PPE, but now pt is concerned because she does have the sore throat and fever.   Pt also notes she has a small red painful bump on Left labia c/w prior infected cysts.  Pain started about 2-3 days ago.  She did start taking leftover doxycycline but is not sure how old the medication is.    Past Medical History:  Diagnosis Date  . HSV-1 (herpes simplex virus 1) infection     Patient Active Problem List   Diagnosis Date Noted  . Anxiety 12/30/2017  . BMI 37.0-37.9, adult 12/30/2017  . History of PCR DNA positive for HSV1 12/30/2017    Past Surgical History:  Procedure Laterality Date  . BREAST REDUCTION SURGERY  2011  . CHOLECYSTECTOMY      OB History   No obstetric history on file.      Home Medications    Prior to Admission medications   Medication Sig Start Date End Date Taking? Authorizing Provider  ALPRAZolam Duanne Moron) 0.5 MG tablet Take 0.5-1 tablets (0.25-0.5 mg total) by mouth 2 (two) times daily as needed for anxiety. 11/21/18  Yes Emeterio Reeve, DO   cyclobenzaprine (FLEXERIL) 10 MG tablet Take 1 tablet (10 mg total) by mouth 3 (three) times daily as needed for muscle spasms. 01/16/19  Yes Emeterio Reeve, DO  valACYclovir (VALTREX) 1000 MG tablet Take 1 tablet (1,000 mg total) by mouth daily. 02/12/18  Yes Emeterio Reeve, DO  clindamycin (CLEOCIN) 300 MG capsule Take 1 capsule (300 mg total) by mouth 3 (three) times daily for 7 days. 02/12/19 02/19/19  Noe Gens, PA-C  fluconazole (DIFLUCAN) 150 MG tablet Take 1 tablet (150 mg total) by mouth once for 1 dose. 02/12/19 02/12/19  Noe Gens, PA-C  HYDROcodone-acetaminophen (NORCO/VICODIN) 5-325 MG tablet Take one by mouth at bedtime as needed for pain.  May repeat 4 to 6hr later prn. Patient not taking: Reported on 08/18/2018 06/30/18   Kandra Nicolas, MD  mupirocin ointment (BACTROBAN) 2 % Apply 1 application topically 3 (three) times daily. Patient not taking: Reported on 08/18/2018 06/30/18   Kandra Nicolas, MD  phentermine (ADIPEX-P) 37.5 MG tablet Take 1 tablet (37.5 mg total) by mouth daily before breakfast. 05/05/18   Emeterio Reeve, DO  predniSONE (DELTASONE) 50 MG tablet Take 1 tablet (50 mg total) by mouth daily with breakfast for 5 days. 02/12/19 02/17/19  Noe Gens, PA-C    Family History Family History  Problem Relation  Age of Onset  . Skin cancer Mother   . High blood pressure Father   . Diabetes Maternal Grandmother   . Stroke Maternal Grandmother   . Diabetes Maternal Grandfather   . Stroke Maternal Grandfather     Social History Social History   Tobacco Use  . Smoking status: Never Smoker  . Smokeless tobacco: Never Used  Substance Use Topics  . Alcohol use: Yes    Comment: 3-4/week  . Drug use: Never     Allergies   Patient has no known allergies.   Review of Systems Review of Systems  Constitutional: Positive for fever. Negative for chills.  HENT: Positive for sore throat. Negative for congestion, ear pain, trouble swallowing and voice  change.   Respiratory: Negative for cough and shortness of breath.   Cardiovascular: Negative for chest pain and palpitations.  Gastrointestinal: Negative for abdominal pain, diarrhea, nausea and vomiting.  Musculoskeletal: Negative for arthralgias, back pain and myalgias.  Skin: Negative for rash.     Physical Exam Triage Vital Signs ED Triage Vitals  Enc Vitals Group     BP 02/12/19 1134 (!) 129/91     Pulse Rate 02/12/19 1134 83     Resp 02/12/19 1134 16     Temp 02/12/19 1134 97.9 F (36.6 C)     Temp Source 02/12/19 1134 Oral     SpO2 02/12/19 1134 100 %     Weight 02/12/19 1134 188 lb (85.3 kg)     Height 02/12/19 1134 5\' 3"  (1.6 m)     Head Circumference --      Peak Flow --      Pain Score 02/12/19 1141 4     Pain Loc --      Pain Edu? --      Excl. in GC? --    No data found.  Updated Vital Signs BP (!) 129/91 (BP Location: Left Arm)   Pulse 83   Temp 97.9 F (36.6 C) (Oral)   Resp 16   Ht 5\' 3"  (1.6 m)   Wt 188 lb (85.3 kg)   LMP 01/28/2019   SpO2 100%   BMI 33.30 kg/m   Visual Acuity Right Eye Distance:   Left Eye Distance:   Bilateral Distance:    Right Eye Near:   Left Eye Near:    Bilateral Near:     Physical Exam Vitals signs and nursing note reviewed.  Constitutional:      Appearance: She is well-developed.  HENT:     Head: Normocephalic and atraumatic.     Right Ear: Tympanic membrane normal.     Left Ear: Tympanic membrane normal.     Nose: Nose normal.     Right Sinus: No maxillary sinus tenderness or frontal sinus tenderness.     Left Sinus: No maxillary sinus tenderness or frontal sinus tenderness.     Mouth/Throat:     Lips: Pink.     Mouth: Mucous membranes are moist.     Pharynx: Oropharynx is clear. Uvula midline. Posterior oropharyngeal erythema present. No pharyngeal swelling or uvula swelling.     Tonsils: Tonsillar exudate present. No tonsillar abscesses. 3+ on the right. 3+ on the left.  Neck:     Musculoskeletal:  Normal range of motion and neck supple.  Cardiovascular:     Rate and Rhythm: Normal rate and regular rhythm.  Pulmonary:     Effort: Pulmonary effort is normal. No respiratory distress.     Breath sounds: Normal breath  sounds. No stridor. No wheezing, rhonchi or rales.  Genitourinary:   Musculoskeletal: Normal range of motion.  Lymphadenopathy:     Cervical: Cervical adenopathy present.  Skin:    General: Skin is warm and dry.  Neurological:     Mental Status: She is alert and oriented to person, place, and time.  Psychiatric:        Behavior: Behavior normal.      UC Treatments / Results  Labs (all labs ordered are listed, but only abnormal results are displayed) Labs Reviewed  STREP A DNA PROBE  NOVEL CORONAVIRUS, NAA  POCT RAPID STREP A (OFFICE)    EKG   Radiology No results found.  Procedures Procedures (including critical care time)  Medications Ordered in UC Medications - No data to display  Initial Impression / Assessment and Plan / UC Course  I have reviewed the triage vital signs and the nursing notes.  Pertinent labs & imaging results that were available during my care of the patient were reviewed by me and considered in my medical decision making (see chart for details).     Rapid strep: NEGATIVE Culture sent Rapid may have been affected by doxycycline pt already started to take for labia sore. Throat exam concerning for streph pharyngitis. Will start on clindamycin to treat suspected strep as well as labia skin infection.  Due to pt hx of being around a Covid positive patient last week, will test for Covid-19. AVS and work note provided.  Final Clinical Impressions(s) / UC Diagnoses   Final diagnoses:  Sore throat  Exudative tonsillitis  Exposure to Covid-19 Virus  Abscess of left genital labia     Discharge Instructions      Please take antibiotics as prescribed and be sure to complete entire course even if you start to feel better  to ensure infection does not come back.  You will be notified of the strep culture results in about 2-3 days.  Due to your exam being concerning for strep as well as having the skin infection, please start your antibiotic today.   Due to concern for possibly having Covid-19, it is advised that you self-isolate at home until test results come back (usually 2-3 days).  If positive, it is recommended you stay isolated for at least 7 days after symptom onset and 24 hours after last fever without taking medication (whichever is longer).  If you MUST go out, please wear a mask at all times, limit contact with others.   Do mention to [facility] you are awaiting Covid results when it is time for you to fill out your daily Covid survey.      ED Prescriptions    Medication Sig Dispense Auth. Provider   clindamycin (CLEOCIN) 300 MG capsule Take 1 capsule (300 mg total) by mouth 3 (three) times daily for 7 days. 21 capsule Doroteo Glassman, Mickael Mcnutt O, PA-C   fluconazole (DIFLUCAN) 150 MG tablet Take 1 tablet (150 mg total) by mouth once for 1 dose. 2 tablet Doroteo Glassman, Kataleena Holsapple O, PA-C   predniSONE (DELTASONE) 50 MG tablet Take 1 tablet (50 mg total) by mouth daily with breakfast for 5 days. 5 tablet Lurene Shadow, PA-C     PDMP not reviewed this encounter.   Lurene Shadow, PA-C 02/12/19 1340

## 2019-02-13 LAB — STREP A DNA PROBE: Group A Strep Probe: NOT DETECTED

## 2019-02-14 LAB — NOVEL CORONAVIRUS, NAA: SARS-CoV-2, NAA: NOT DETECTED

## 2019-02-15 ENCOUNTER — Encounter (HOSPITAL_COMMUNITY): Payer: Self-pay

## 2019-02-24 ENCOUNTER — Encounter: Payer: Self-pay | Admitting: Certified Nurse Midwife

## 2019-02-24 ENCOUNTER — Other Ambulatory Visit (HOSPITAL_COMMUNITY)
Admission: RE | Admit: 2019-02-24 | Discharge: 2019-02-24 | Disposition: A | Discharge status: Home / Self Care | Payer: 59 | Source: Ambulatory Visit | Attending: Certified Nurse Midwife | Admitting: Certified Nurse Midwife

## 2019-02-24 ENCOUNTER — Ambulatory Visit: Payer: 59 | Admitting: Certified Nurse Midwife

## 2019-02-24 ENCOUNTER — Other Ambulatory Visit: Payer: Self-pay

## 2019-02-24 VITALS — BP 102/70 | HR 64 | Temp 97.2°F | Resp 16 | Ht 63.75 in | Wt 193.0 lb

## 2019-02-24 DIAGNOSIS — Z124 Encounter for screening for malignant neoplasm of cervix: Secondary | ICD-10-CM

## 2019-02-24 DIAGNOSIS — Z01419 Encounter for gynecological examination (general) (routine) without abnormal findings: Secondary | ICD-10-CM | POA: Diagnosis not present

## 2019-02-24 DIAGNOSIS — Z113 Encounter for screening for infections with a predominantly sexual mode of transmission: Secondary | ICD-10-CM | POA: Diagnosis not present

## 2019-02-24 DIAGNOSIS — N907 Vulvar cyst: Secondary | ICD-10-CM | POA: Diagnosis not present

## 2019-02-24 NOTE — Patient Instructions (Signed)
General topics  Next pap or exam is  due in 1 year Take a Women's multivitamin Take 1200 mg. of calcium daily - prefer dietary If any concerns in interim to call back  Breast Self-Awareness Practicing breast self-awareness may pick up problems early, prevent significant medical complications, and possibly save your life. By practicing breast self-awareness, you can become familiar with how your breasts look and feel and if your breasts are changing. This allows you to notice changes early. It can also offer you some reassurance that your breast health is good. One way to learn what is normal for your breasts and whether your breasts are changing is to do a breast self-exam. If you find a lump or something that was not present in the past, it is best to contact your caregiver right away. Other findings that should be evaluated by your caregiver include nipple discharge, especially if it is bloody; skin changes or reddening; areas where the skin seems to be pulled in (retracted); or new lumps and bumps. Breast pain is seldom associated with cancer (malignancy), but should also be evaluated by a caregiver. BREAST SELF-EXAM The best time to examine your breasts is 5 7 days after your menstrual period is over.  [employer] Patient Information 2013 [place].   Exercise to Stay Healthy Exercise helps you become and stay healthy. EXERCISE IDEAS AND TIPS Choose exercises that:  You enjoy.  Fit into your day. You do not need to exercise really hard to be healthy. You can do exercises at a slow or medium level and stay healthy. You can:  Stretch before and after working out.  Try yoga, Pilates, or tai chi.  Lift weights.  Walk fast, swim, jog, run, climb stairs, bicycle, dance, or rollerskate.  Take aerobic classes. Exercises that burn about 150 calories:  Running 1  miles in 15 minutes.  Playing volleyball for 45 to 60 minutes.  Washing and waxing a car for 45 to 60 minutes.   Playing touch football for 45 minutes.  Walking 1  miles in 35 minutes.  Pushing a stroller 1  miles in 30 minutes.  Playing basketball for 30 minutes.  Raking leaves for 30 minutes.  Bicycling 5 miles in 30 minutes.  Walking 2 miles in 30 minutes.  Dancing for 30 minutes.  Shoveling snow for 15 minutes.  Swimming laps for 20 minutes.  Walking up stairs for 15 minutes.  Bicycling 4 miles in 15 minutes.  Gardening for 30 to 45 minutes.  Jumping rope for 15 minutes.  Washing windows or floors for 45 to 60 minutes. Document Released: 06/13/2010 Document Revised: 08/03/2011 Document Reviewed: 06/13/2010 [facility] Patient Information 2013 [place].   Other topics ( that may be useful information):    Sexually Transmitted Disease Sexually transmitted disease (STD) refers to any infection that is passed from person to person during sexual activity. This may happen by way of saliva, semen, blood, vaginal mucus, or urine. Common STDs include:  Gonorrhea.  Chlamydia.  Syphilis.  HIV/AIDS.  Genital herpes.  Hepatitis B and C.  Trichomonas.  Human papillomavirus (HPV).  Pubic lice. CAUSES  An STD may be spread by bacteria, virus, or parasite. A person can get an STD by:  Sexual intercourse with an infected person.  Sharing sex toys with an infected person.  Sharing needles with an infected person.  Having intimate contact with the genitals, mouth, or rectal areas of an infected person. SYMPTOMS  Some people may not have any symptoms, but  they can still pass the infection to others. Different STDs have different symptoms. Symptoms include:  Painful or bloody urination.  Pain in the pelvis, abdomen, vagina, anus, throat, or eyes.  Skin rash, itching, irritation, growths, or sores (lesions). These usually occur in the genital or anal area.  Abnormal vaginal discharge.  Penile discharge in men.  Soft, flesh-colored skin growths in the genital  or anal area.  Fever.  Pain or bleeding during sexual intercourse.  Swollen glands in the groin area.  Yellow skin and eyes (jaundice). This is seen with hepatitis. DIAGNOSIS  To make a diagnosis, your caregiver may:  Take a medical history.  Perform a physical exam.  Take a specimen (culture) to be examined.  Examine a sample of discharge under a microscope.  Perform blood test TREATMENT   Chlamydia, gonorrhea, trichomonas, and syphilis can be cured with antibiotic medicine.  Genital herpes, hepatitis, and HIV can be treated, but not cured, with prescribed medicines. The medicines will lessen the symptoms.  Genital warts from HPV can be treated with medicine or by freezing, burning (electrocautery), or surgery. Warts may come back.  HPV is a virus and cannot be cured with medicine or surgery.However, abnormal areas may be followed very closely by your caregiver and may be removed from the cervix, vagina, or vulva through office procedures or surgery. If your diagnosis is confirmed, your recent sexual partners need treatment. This is true even if they are symptom-free or have a negative culture or evaluation. They should not have sex until their caregiver says it is okay. HOME CARE INSTRUCTIONS  All sexual partners should be informed, tested, and treated for all STDs.  Take your antibiotics as directed. Finish them even if you start to feel better.  Only take over-the-counter or prescription medicines for pain, discomfort, or fever as directed by your caregiver.  Rest.  Eat a balanced diet and drink enough fluids to keep your urine clear or pale yellow.  Do not have sex until treatment is completed and you have followed up with your caregiver. STDs should be checked after treatment.  Keep all follow-up appointments, Pap tests, and blood tests as directed by your caregiver.  Only use latex condoms and water-soluble lubricants during sexual activity. Do not use petroleum  jelly or oils.  Avoid alcohol and illegal drugs.  Get vaccinated for HPV and hepatitis. If you have not received these vaccines in the past, talk to your caregiver about whether one or both might be right for you.  Avoid risky sex practices that can break the skin. The only way to avoid getting an STD is to avoid all sexual activity.Latex condoms and dental dams (for oral sex) will help lessen the risk of getting an STD, but will not completely eliminate the risk. SEEK MEDICAL CARE IF:   You have a fever.  You have any new or worsening symptoms. Document Released: 08/01/2002 Document Revised: 08/03/2011 Document Reviewed: 08/08/2010 [facility] Patient Information 2013 [place].    Domestic Abuse You are being battered or abused if someone close to you hits, pushes, or physically hurts you in any way. You also are being abused if you are forced into activities. You are being sexually abused if you are forced to have sexual contact of any kind. You are being emotionally abused if you are made to feel worthless or if you are constantly threatened. It is important to remember that help is available. No one has the right to abuse you. PREVENTION OF FURTHER  ABUSE  Learn the warning signs of danger. This varies with situations but may include: the use of alcohol, threats, isolation from friends and family, or forced sexual contact. Leave if you feel that violence is going to occur.  If you are attacked or beaten, report it to the police so the abuse is documented. You do not have to press charges. The police can protect you while you or the attackers are leaving. Get the officer's name and badge number and a copy of the report.  Find someone you can trust and tell them what is happening to you: your caregiver, a nurse, clergy member, close friend or family member. Feeling ashamed is natural, but remember that you have done nothing wrong. No one deserves abuse. Document Released: 05/08/2000  Document Revised: 08/03/2011 Document Reviewed: 07/17/2010 [facility] Patient Information 2013 [place].    How Much is Too Much Alcohol? Drinking too much alcohol can cause injury, accidents, and health problems. These types of problems can include:   Car crashes.  Falls.  Family fighting (domestic violence).  Drowning.  Fights.  Injuries.  Burns.  Damage to certain organs.  Having a baby with birth defects. ONE DRINK CAN BE TOO MUCH WHEN YOU ARE:  Working.  Pregnant or breastfeeding.  Taking medicines. Ask your doctor.  Driving or planning to drive. If you or someone you know has a drinking problem, get help from a doctor.  Document Released: 03/07/2009 Document Revised: 08/03/2011 Document Reviewed: 03/07/2009 [facility] Patient Information 2013 [place].   Smoking Hazards Smoking cigarettes is extremely bad for your health. Tobacco smoke has over 200 known poisons in it. There are over 60 chemicals in tobacco smoke that cause cancer. Some of the chemicals found in cigarette smoke include:   Cyanide.  Benzene.  Formaldehyde.  Methanol (wood alcohol).  Acetylene (fuel used in welding torches).  Ammonia. Cigarette smoke also contains the poisonous gases nitrogen oxide and carbon monoxide.  Cigarette smokers have an increased risk of many serious medical problems and Smoking causes approximately:  90% of all lung cancer deaths in men.  80% of all lung cancer deaths in women.  90% of deaths from chronic obstructive lung disease. Compared with nonsmokers, smoking increases the risk of:  Coronary heart disease by 2 to 4 times.  Stroke by 2 to 4 times.  Men developing lung cancer by 23 times.  Women developing lung cancer by 13 times.  Dying from chronic obstructive lung diseases by 12 times.  . Smoking is the most preventable cause of death and disease in our society.  WHY IS SMOKING ADDICTIVE?  Nicotine is the chemical agent in  tobacco that is capable of causing addiction or dependence.  When you smoke and inhale, nicotine is absorbed rapidly into the bloodstream through your lungs. Nicotine absorbed through the lungs is capable of creating a powerful addiction. Both inhaled and non-inhaled nicotine may be addictive.  Addiction studies of cigarettes and spit tobacco show that addiction to nicotine occurs mainly during the teen years, when young people begin using tobacco products. WHAT ARE THE BENEFITS OF QUITTING?  There are many health benefits to quitting smoking.   Likelihood of developing cancer and heart disease decreases. Health improvements are seen almost immediately.  Blood pressure, pulse rate, and breathing patterns start returning to normal soon after quitting. QUITTING SMOKING   American Lung Association - 1-800-LUNGUSA  American Cancer Society - 1-800-ACS-2345 Document Released: 06/18/2004 Document Revised: 08/03/2011 Document Reviewed: 02/20/2009 [facility] Patient Information 2013 [place].   Stress Management Stress is a state of physical or mental tension that often results from changes in your life or normal routine. Some common causes of stress are:  Death of a loved one.  Injuries or severe illnesses.  Getting fired or changing jobs.  Moving into a new home. Other causes may be:  Sexual problems.  Business or financial losses.  Taking on a large debt.  Regular conflict with someone at home or at work.  Constant tiredness from lack of sleep. It is not just bad things that are stressful. It may be stressful to:  Win the lottery.  Get married.  Buy a new car. The amount of stress that can be easily tolerated varies from person to person. Changes generally cause stress, regardless of the types of change. Too much stress can affect your health. It may lead to physical or emotional problems. Too little stress (boredom) may also become stressful. SUGGESTIONS TO REDUCE  STRESS:  Talk things over with your family and friends. It often is helpful to share your concerns and worries. If you feel your problem is serious, you may want to get help from a professional counselor.  Consider your problems one at a time instead of lumping them all together. Trying to take care of everything at once may seem impossible. List all the things you need to do and then start with the most important one. Set a goal to accomplish 2 or 3 things each day. If you expect to do too many in a single day you will naturally fail, causing you to feel even more stressed.  Do not use alcohol or drugs to relieve stress. Although you may feel better for a short time, they do not remove the problems that caused the stress. They can also be habit forming.  Exercise regularly - at least 3 times per week. Physical exercise can help to relieve that "uptight" feeling and will relax you.  The shortest distance between despair and hope is often a good night's sleep.  Go to bed and get up on time allowing yourself time for appointments without being rushed.  Take a short "time-out" period from any stressful situation that occurs during the day. Close your eyes and take some deep breaths. Starting with the muscles in your face, tense them, hold it for a few seconds, then relax. Repeat this with the muscles in your neck, shoulders, hand, stomach, back and legs.  Take good care of yourself. Eat a balanced diet and get plenty of rest.  Schedule time for having fun. Take a break from your daily routine to relax. HOME CARE INSTRUCTIONS   Call if you feel overwhelmed by your problems and feel you can no longer manage them on your own.  Return immediately if you feel like hurting yourself or someone else. Document Released: 11/04/2000 Document Revised: 08/03/2011 Document Reviewed: 06/27/2007 [facility] Patient Information 2013 [place].

## 2019-02-24 NOTE — Progress Notes (Signed)
28 y.o. G0P0000 Single  Caucasian Fe here to establish gyn care and for annual exam. Periods normal and monthly. Cramping and bleeding more on day 1-2 and then tapers down, duration 4-5 days. Inner vulva cyst, that fluctuates, and was Clindamycin x 5 days. Not painful or irritated. Prefers STD screening. HSV 1 occasional flare. Takes suppression daily.  No other health issues today.  Patient's last menstrual period was 02/20/2019 (exact date).          Sexually active: Yes.    The current method of family planning is vasectomy.    Exercising: Yes.    horeback riding & walking Smoker:  no  Review of Systems  Constitutional: Negative.   HENT: Negative.   Eyes: Negative.   Respiratory: Negative.   Cardiovascular: Negative.   Gastrointestinal: Negative.   Genitourinary: Negative.   Musculoskeletal: Negative.   Skin:       Vaginal  cyst  Neurological: Negative.   Endo/Heme/Allergies: Negative.   Psychiatric/Behavioral: Negative.     Health Maintenance: Pap:  2018 abnormal, had colposcopy benign History of Abnormal Pap: yes MMG:  none Self Breast exams: yes Colonoscopy:  none BMD:   none TDaP:  2015 Shingles: not done Pneumonia: not done Hep C and HIV: both neg 2019 Labs: if needed   reports that she has never smoked. She has never used smokeless tobacco. She reports current alcohol use. She reports that she does not use drugs.  Past Medical History:  Diagnosis Date  . Abnormal Pap smear of cervix    2018  . Anemia   . Anxiety   . HSV-1 (herpes simplex virus 1) infection   . STD (sexually transmitted disease)    HPV    Past Surgical History:  Procedure Laterality Date  . BREAST REDUCTION SURGERY  2011  . CHOLECYSTECTOMY      Current Outpatient Medications  Medication Sig Dispense Refill  . ALPRAZolam (XANAX) 0.5 MG tablet Take 0.5-1 tablets (0.25-0.5 mg total) by mouth 2 (two) times daily as needed for anxiety. 30 tablet 0  . cyclobenzaprine (FLEXERIL) 10 MG  tablet Take 1 tablet (10 mg total) by mouth 3 (three) times daily as needed for muscle spasms. 20 tablet 1  . ibuprofen (ADVIL) 800 MG tablet Take 800 mg by mouth as needed.    . valACYclovir (VALTREX) 1000 MG tablet Take 1 tablet (1,000 mg total) by mouth daily. 90 tablet 3   No current facility-administered medications for this visit.     Family History  Problem Relation Age of Onset  . Skin cancer Mother   . Hypertension Father   . Diabetes Maternal Grandmother   . Stroke Maternal Grandmother   . Diabetes Maternal Grandfather   . Stroke Maternal Grandfather     ROS:  Pertinent items are noted in HPI.  Otherwise, a comprehensive ROS was negative.  Exam:   BP 102/70   Pulse 64   Temp (!) 97.2 F (36.2 C) (Skin)   Resp 16   Ht 5' 3.75" (1.619 m)   Wt 193 lb (87.5 kg)   LMP 02/20/2019 (Exact Date)   BMI 33.39 kg/m  Height: 5' 3.75" (161.9 cm) Ht Readings from Last 3 Encounters:  02/24/19 5' 3.75" (1.619 m)  02/12/19 5\' 3"  (1.6 m)  06/30/18 5\' 4"  (1.626 m)    General appearance: alert, cooperative and appears stated age Head: Normocephalic, without obvious abnormality, atraumatic Neck: no adenopathy, supple, symmetrical, trachea midline and thyroid normal to inspection and palpation Lungs: clear  to auscultation bilaterally Breasts: normal appearance, no masses or tenderness, No nipple retraction or dimpling, No nipple discharge or bleeding, No axillary or supraclavicular adenopathy Heart: regular rate and rhythm Abdomen: soft, non-tender; no masses,  no organomegaly Extremities: extremities normal, atraumatic, no cyanosis or edema Skin: Skin color, texture, turgor normal. No rashes or lesions Lymph nodes: Cervical, supraclavicular, and axillary nodes normal. No abnormal inguinal nodes palpated Neurologic: Grossly normal   Pelvic: External genitalia:  no lesions, inside left vulva very tiny cyst, resolved, no exudate or tenderness, slight increase pink only               Urethra:  normal appearing urethra with no masses, tenderness or lesions              Bartholin's and Skene's: normal               Vagina: normal appearing vagina with normal color and discharge, no lesions              Cervix: no cervical motion tenderness, no lesions and nulliparous appearance              Pap taken: No. Bimanual Exam:  Uterus:  normal size, contour, position, consistency, mobility, non-tender and anteverted              Adnexa: normal adnexa and no mass, fullness, tenderness,                Rectovaginal: Confirms               Anus:  normal appearance no lesions  Chaperone present: yes  A:  Well Woman with normal exam  Contraception partner vasectomy  STD screening vaginal only  History of left vulva cyst, treated with Clindamycin, resolving  History of abnormal pap smear with colposcopy ? Etiology. Repeat today.  P:   Reviewed health and wellness pertinent to exam   Discussed STD prevention.  Lab: GC,Chlamydia, Affirm  Discussed finding and need to use of epsom salt soak if needed for comfort and when first occurs, my not need antibiotics each time.  Will request records.  Pap smear: yes  counseled on breast self exam, STD prevention, HIV risk factors and prevention, feminine hygiene, adequate intake of calcium and vitamin D, diet and exercise  return annually or prn  An After Visit Summary was printed and given to the patient.

## 2019-02-25 LAB — VAGINITIS/VAGINOSIS, DNA PROBE
Candida Species: NEGATIVE
Gardnerella vaginalis: NEGATIVE
Trichomonas vaginosis: NEGATIVE

## 2019-03-01 LAB — CYTOLOGY - PAP
Chlamydia: NEGATIVE
Diagnosis: NEGATIVE
High risk HPV: NEGATIVE
Neisseria Gonorrhea: NEGATIVE

## 2019-03-17 ENCOUNTER — Other Ambulatory Visit: Payer: Self-pay | Admitting: Osteopathic Medicine

## 2019-03-17 ENCOUNTER — Telehealth: Payer: Self-pay

## 2019-03-17 ENCOUNTER — Other Ambulatory Visit: Payer: Self-pay

## 2019-03-17 DIAGNOSIS — Z8619 Personal history of other infectious and parasitic diseases: Secondary | ICD-10-CM

## 2019-03-17 MED ORDER — VALACYCLOVIR HCL 1 G PO TABS: 1000.0000 mg | ORAL_TABLET | Freq: Every day | ORAL | 0 refills | Status: DC

## 2019-03-17 NOTE — Telephone Encounter (Signed)
Pt called requesting med refill for xanax. Pls send to [facility]. Thanks.

## 2019-03-20 MED ORDER — ALPRAZOLAM 0.5 MG PO TABS: 0.2500 mg | ORAL_TABLET | Freq: Two times a day (BID) | ORAL | 0 refills | Status: DC | PRN

## 2019-03-20 NOTE — Telephone Encounter (Signed)
[facility]

## 2019-03-20 NOTE — Telephone Encounter (Signed)
Pt advised.

## 2019-03-22 MED FILL — valACYclovir HCL 1 GM TABS: 1 | 90 days supply | Qty: 90 | Fill #0

## 2019-06-19 ENCOUNTER — Other Ambulatory Visit: Payer: Self-pay | Admitting: Osteopathic Medicine

## 2019-06-19 DIAGNOSIS — Z8619 Personal history of other infectious and parasitic diseases: Secondary | ICD-10-CM

## 2019-06-19 MED FILL — LARIN 21 1-20 TABLET: 1-20 | 84 days supply | Qty: 112 | Fill #0

## 2019-06-20 MED FILL — valACYclovir HCL 1 GM TABS: 1 | 90 days supply | Qty: 90 | Fill #0

## 2019-06-21 ENCOUNTER — Telehealth: Payer: Self-pay

## 2019-06-21 MED ORDER — ALPRAZOLAM 0.5 MG PO TABS: 0.2500 mg | ORAL_TABLET | Freq: Two times a day (BID) | ORAL | 0 refills | Status: DC | PRN

## 2019-06-21 MED FILL — ALPRAZolam 0.5 MG TABS: 0.5 | 15 days supply | Qty: 30 | Fill #0

## 2019-06-21 NOTE — Telephone Encounter (Signed)
Pt called requesting med refill for alprazolam. Pls send to the [facility] pharmacy.

## 2019-06-30 ENCOUNTER — Encounter: Payer: Self-pay | Admitting: Nurse Practitioner

## 2019-06-30 ENCOUNTER — Telehealth: Payer: Self-pay | Admitting: Nurse Practitioner

## 2019-06-30 ENCOUNTER — Ambulatory Visit (INDEPENDENT_AMBULATORY_CARE_PROVIDER_SITE_OTHER): Payer: 59 | Admitting: Nurse Practitioner

## 2019-06-30 ENCOUNTER — Ambulatory Visit: Payer: 59 | Admitting: Family Medicine

## 2019-06-30 VITALS — HR 87 | Temp 98.9°F

## 2019-06-30 DIAGNOSIS — Z206 Contact with and (suspected) exposure to human immunodeficiency virus [HIV]: Secondary | ICD-10-CM

## 2019-06-30 MED ORDER — EMTRICITABINE-TENOFOVIR DF 200-300 MG PO TABS: 1.0000 | ORAL_TABLET | Freq: Every day | ORAL | 0 refills | Status: AC

## 2019-06-30 MED ORDER — TIVICAY 50 MG PO TABS: 50.0000 mg | ORAL_TABLET | Freq: Every day | ORAL | 0 refills | Status: AC

## 2019-06-30 MED FILL — TIVICAY 50 MG TABLET: 50 | 28 days supply | Qty: 28 | Fill #0

## 2019-06-30 MED FILL — TRUVADA 200-300 MG TABS: 200-300 | 30 days supply | Qty: 30 | Fill #0 | Status: TO

## 2019-06-30 MED FILL — TIVICAY 50 MG TABLET: 50 | 30 days supply | Qty: 30 | Fill #0 | Status: TO

## 2019-06-30 MED FILL — TRUVADA 200-300 MG TABS: 200-300 | 28 days supply | Qty: 28 | Fill #0
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
## 2019-08-07 ENCOUNTER — Encounter: Payer: Self-pay | Admitting: Certified Nurse Midwife

## 2019-08-09 ENCOUNTER — Encounter: Payer: Self-pay | Admitting: Certified Nurse Midwife

## 2019-10-02 ENCOUNTER — Encounter: Payer: Self-pay | Admitting: Nurse Practitioner

## 2019-10-02 ENCOUNTER — Other Ambulatory Visit: Payer: Self-pay

## 2019-10-02 ENCOUNTER — Ambulatory Visit (INDEPENDENT_AMBULATORY_CARE_PROVIDER_SITE_OTHER): Payer: 59 | Admitting: Nurse Practitioner

## 2019-10-02 VITALS — BP 128/79 | HR 88 | Ht 63.75 in | Wt 201.0 lb

## 2019-10-02 DIAGNOSIS — Z206 Contact with and (suspected) exposure to human immunodeficiency virus [HIV]: Secondary | ICD-10-CM

## 2019-10-02 DIAGNOSIS — Z6834 Body mass index (BMI) 34.0-34.9, adult: Secondary | ICD-10-CM

## 2019-10-02 DIAGNOSIS — Z7721 Contact with and (suspected) exposure to potentially hazardous body fluids: Secondary | ICD-10-CM

## 2019-10-02 DIAGNOSIS — F419 Anxiety disorder, unspecified: Secondary | ICD-10-CM | POA: Diagnosis not present

## 2019-10-02 DIAGNOSIS — Z8619 Personal history of other infectious and parasitic diseases: Secondary | ICD-10-CM

## 2019-10-02 MED ORDER — VALACYCLOVIR HCL 1 G PO TABS: 1000.0000 mg | ORAL_TABLET | Freq: Every day | ORAL | 3 refills | Status: DC

## 2019-10-02 MED ORDER — ALPRAZOLAM 0.5 MG PO TABS: 0.2500 mg | ORAL_TABLET | Freq: Two times a day (BID) | ORAL | 2 refills | Status: DC | PRN

## 2019-10-02 MED ORDER — PHENTERMINE HCL 37.5 MG PO CAPS: 37.5000 mg | ORAL_CAPSULE | ORAL | 1 refills | Status: DC

## 2019-10-02 NOTE — Patient Instructions (Signed)
If everything is good today, we will plan to check your labs again in 6 months. If you begin to develop any symptoms, please do not hesitate to reach out and we can test sooner.   Phentermine tablets or capsules What is this medicine? PHENTERMINE (FEN ter meen) decreases your appetite. It is used with a reduced calorie diet and exercise to help you lose weight. This medicine may be used for other purposes; ask your health care provider or pharmacist if you have questions. COMMON BRAND NAME(S): Adipex-P, Atti-Plex P, Atti-Plex P Spansule, Fastin, Lomaira, Pro-Fast, Tara-8 What should I tell my health care provider before I take this medicine? They need to know if you have any of these conditions:  agitation or nervousness  diabetes  glaucoma  heart disease  high blood pressure  history of drug abuse or addiction  history of stroke  kidney disease  lung disease called Primary Pulmonary Hypertension (PPH)  taken an MAOI like Carbex, Eldepryl, Marplan, Nardil, or Parnate in last 14 days  taking stimulant medicines for attention disorders, weight loss, or to stay awake  thyroid disease  an unusual or allergic reaction to phentermine, other medicines, foods, dyes, or preservatives  pregnant or trying to get pregnant  breast-feeding How should I use this medicine? Take this medicine by mouth with a glass of water. Follow the directions on the prescription label. Take your medicine at regular intervals. Do not take it more often than directed. Do not stop taking except on your doctor's advice. Talk to your pediatrician regarding the use of this medicine in children. While this drug may be prescribed for children 17 years or older for selected conditions, precautions do apply. Overdosage: If you think you have taken too much of this medicine contact a poison control center or emergency room at once. NOTE: This medicine is only for you. Do not share this medicine with others. What  if I miss a dose? If you miss a dose, take it as soon as you can. If it is almost time for your next dose, take only that dose. Do not take double or extra doses. What may interact with this medicine? Do not take this medicine with any of the following medications:  MAOIs like Carbex, Eldepryl, Marplan, Nardil, and Parnate This medicine may also interact with the following medications:  alcohol  certain medicines for depression, anxiety, or psychotic disorders  certain medicines for high blood pressure  linezolid  medicines for colds or breathing difficulties like pseudoephedrine or phenylephrine  medicines for diabetes  sibutramine  stimulant medicines for attention disorders, weight loss, or to stay awake This list may not describe all possible interactions. Give your health care provider a list of all the medicines, herbs, non-prescription drugs, or dietary supplements you use. Also tell them if you smoke, drink alcohol, or use illegal drugs. Some items may interact with your medicine. What should I watch for while using this medicine? Visit your doctor or health care provider for regular checks on your progress. Do not stop taking except on your health care provider's advice. You may develop a severe reaction. Your health care provider will tell you how much medicine to take. Do not take this medicine close to bedtime. It may prevent you from sleeping. You may get drowsy or dizzy. Do not drive, use machinery, or do anything that needs mental alertness until you know how this medicine affects you. Do not stand or sit up quickly, especially if you are an older patient.  This reduces the risk of dizzy or fainting spells. Alcohol may increase dizziness and drowsiness. Avoid alcoholic drinks. This medicine may affect blood sugar levels. Ask your healthcare provider if changes in diet or medicines are needed if you have diabetes. Women should inform their health care provider if they wish  to become pregnant or think they might be pregnant. Losing weight while pregnant is not advised and may cause harm to the unborn child. Talk to your health care provider for more information. What side effects may I notice from receiving this medicine? Side effects that you should report to your doctor or health care professional as soon as possible:  allergic reactions like skin rash, itching or hives, swelling of the face, lips, or tongue  breathing problems  changes in emotions or moods  changes in vision  chest pain or chest tightness  fast, irregular heartbeat  feeling faint or lightheaded  increased blood pressure  irritable  restlessness  tremors  seizures  signs and symptoms of a stroke like changes in vision; confusion; trouble speaking or understanding; severe headaches; sudden numbness or weakness of the face, arm or leg; trouble walking; dizziness; loss of balance or coordination  unusually weak or tired Side effects that usually do not require medical attention (report to your doctor or health care professional if they continue or are bothersome):  changes in taste  constipation or diarrhea  dizziness  dry mouth  headache  trouble sleeping  upset stomach This list may not describe all possible side effects. Call your doctor for medical advice about side effects. You may report side effects to FDA at 1-800-FDA-1088. Where should I keep my medicine? Keep out of the reach of children. This medicine can be abused. Keep your medicine in a safe place to protect it from theft. Do not share this medicine with anyone. Selling or giving away this medicine is dangerous and against the law. This medicine may cause harm and death if it is taken by other adults, children, or pets. Return medicine that has not been used to an official disposal site. Contact the DEA at (774)262-0359 or your city/county government to find a site. If you cannot return the medicine, mix  any unused medicine with a substance like cat litter or coffee grounds. Then throw the medicine away in a sealed container like a sealed bag or coffee can with a lid. Do not use the medicine after the expiration date. Store at room temperature between 20 and 25 degrees C (68 and 77 degrees F). Keep container tightly closed. NOTE: This sheet is a summary. It may not cover all possible information. If you have questions about this medicine, talk to your doctor, pharmacist, or health care provider.  2020 Elsevier/Gold Standard (2019-03-17 12:54:20)
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
## 2019-10-03 LAB — HEPATITIS C ANTIBODY
Hepatitis C Ab: NONREACTIVE
SIGNAL TO CUT-OFF: 0.01 (ref <1.00)

## 2019-10-03 LAB — HIV ANTIBODY (ROUTINE TESTING W REFLEX): HIV 1&2 Ab, 4th Generation: NONREACTIVE

## 2019-10-03 NOTE — Progress Notes (Signed)
Hepatitis C negative.  HIV negative.  Plan to follow-up with last HIV test in 6 months.

## 2019-10-03 NOTE — Progress Notes (Signed)
HIV test negative! Still waiting on Hep C results.  Plan to follow-up with last HI test in 6 months.

## 2019-11-07 ENCOUNTER — Ambulatory Visit (INDEPENDENT_AMBULATORY_CARE_PROVIDER_SITE_OTHER): Payer: 59 | Admitting: Osteopathic Medicine

## 2019-11-07 ENCOUNTER — Encounter: Payer: Self-pay | Admitting: Osteopathic Medicine

## 2019-11-07 ENCOUNTER — Other Ambulatory Visit: Payer: Self-pay

## 2019-11-07 VITALS — BP 127/77 | HR 66 | Wt 193.0 lb

## 2019-11-07 DIAGNOSIS — Z6834 Body mass index (BMI) 34.0-34.9, adult: Secondary | ICD-10-CM

## 2019-11-07 MED ORDER — PHENTERMINE HCL 37.5 MG PO TABS: 37.5000 mg | ORAL_TABLET | Freq: Every day | ORAL | 0 refills | Status: DC

## 2019-11-07 MED FILL — PHENTERMINE 37.5 MG TABLET: 37.5 | 30 days supply | Qty: 30 | Fill #0

## 2019-11-07 NOTE — Progress Notes (Signed)
Theresa Wheeler is a 29 y.o. female who presents to  [facility]  today, 11/07/19, seeking care for the following: . Refill Rx - phentermine for weight loss   Wt Readings from Last 3 Encounters:  11/07/19 193 lb (87.5 kg) - down 8 lb, ok to continue phentermine   10/02/19 201 lb (91.2 kg) - SaraBeth Early NP started phentermine   02/24/19 193 lb (87.5 kg)        ASSESSMENT & PLAN with other pertinent history/findings:  The encounter diagnosis was BMI 34.0-34.9,adult.    Patient Instructions   Medications approved for long-term use for obesity  Qsymia (Phentermine and Topiramate)  Saxenda (Liraglutide daily sq injection)  Contrave (Bupropion and Naltrexone)  Orlistat (Xenical, Alli)  Bupropion (Wellbutrin) I recommend that you research the above medications and see which one(s) your insurance may or may not cover: If you call your insurance, ask them specifically what medications are on their formulary that are approved for obesity treatment. They should be able to send you a list or tell you over the phone. Remember, medications aren't magic! You MUST be diligent about lifestyle changes as well!      No orders of the defined types were placed in this encounter.   Meds ordered this encounter  Medications  . phentermine (ADIPEX-P) 37.5 MG tablet    Sig: Take 1 tablet (37.5 mg total) by mouth daily before breakfast.    Dispense:  30 tablet    Refill:  0       Follow-up instructions: Return in about 4 weeks (around 12/05/2019) for nurse visit weight / BP check on phentermine..                                         BP 127/77 (BP Location: Left Arm, Patient Position: Sitting)   Pulse 66   Wt 193 lb (87.5 kg)   BMI 33.39 kg/m   Current Meds  Medication Sig  . ALPRAZolam (XANAX) 0.5 MG tablet Take 0.5-1 tablets (0.25-0.5 mg total) by mouth 2 (two) times daily as  needed for anxiety.  Marland Kitchen ibuprofen (ADVIL) 800 MG tablet Take 800 mg by mouth as needed.  . valACYclovir (VALTREX) 1000 MG tablet Take 1 tablet (1,000 mg total) by mouth daily.  . [DISCONTINUED] phentermine 37.5 MG capsule Take 1 capsule (37.5 mg total) by mouth every morning.    No results found for this or any previous visit (from the past 72 hour(s)).  No results found.  Depression screen [facility] 2/9 11/07/2019 10/02/2019 12/29/2017  Decreased Interest 0 0 0  Down, Depressed, Hopeless 0 0 0  PHQ - 2 Score 0 0 0  Altered sleeping 0 0 -  Tired, decreased energy 1 1 -  Change in appetite 0 1 -  Feeling bad or failure about yourself  0 0 -  Trouble concentrating 0 0 -  Moving slowly or fidgety/restless 0 0 -  Suicidal thoughts 0 0 -  PHQ-9 Score 1 2 -  Difficult doing work/chores - Not difficult at all -    GAD 7 : Generalized Anxiety Score 11/07/2019 10/02/2019  Nervous, Anxious, on Edge 1 1  Control/stop worrying 0 0  Worry too much - different things 1 1  Trouble relaxing 0 0  Restless 0 0  Easily annoyed or irritable 0 0  Afraid - awful  might happen 0 0  Total GAD 7 Score 2 2  Anxiety Difficulty - Not difficult at all      All questions at time of visit were answered - patient instructed to contact office with any additional concerns or updates.  ER/RTC precautions were reviewed with the patient.  Please note: voice recognition software was used to produce this document, and typos may escape review. Please contact Dr. Sheppard Coil for any needed clarifications.

## 2019-11-07 NOTE — Patient Instructions (Signed)
  Medications approved for long-term use for obesity  Qsymia (Phentermine and Topiramate)  Saxenda (Liraglutide daily sq injection)  Contrave (Bupropion and Naltrexone)  Orlistat (Xenical, Alli)  Bupropion (Wellbutrin) I recommend that you research the above medications and see which one(s) your insurance may or may not cover: If you call your insurance, ask them specifically what medications are on their formulary that are approved for obesity treatment. They should be able to send you a list or tell you over the phone. Remember, medications aren't magic! You MUST be diligent about lifestyle changes as well!

## 2019-12-29 ENCOUNTER — Other Ambulatory Visit: Payer: Self-pay

## 2019-12-29 ENCOUNTER — Telehealth: Payer: Self-pay

## 2019-12-29 ENCOUNTER — Emergency Department
Admission: RE | Admit: 2019-12-29 | Discharge: 2019-12-29 | Disposition: A | Discharge status: Home / Self Care | Payer: 59 | Source: Ambulatory Visit | Attending: Family Medicine | Admitting: Family Medicine

## 2019-12-29 VITALS — BP 134/89 | HR 80 | Temp 98.3°F | Resp 16

## 2019-12-29 DIAGNOSIS — R310 Gross hematuria: Secondary | ICD-10-CM | POA: Diagnosis not present

## 2019-12-29 LAB — POCT URINALYSIS DIP (MANUAL ENTRY)
Bilirubin, UA: NEGATIVE
Glucose, UA: NEGATIVE mg/dL
Ketones, POC UA: NEGATIVE mg/dL
Nitrite, UA: NEGATIVE
Spec Grav, UA: 1.025 (ref 1.010–1.025)
Urobilinogen, UA: 0.2 E.U./dL
pH, UA: 6 (ref 5.0–8.0)

## 2019-12-29 MED ORDER — FLUCONAZOLE 200 MG PO TABS: 200.0000 mg | ORAL_TABLET | ORAL | 0 refills | Status: AC

## 2019-12-29 MED ORDER — NITROFURANTOIN MONOHYD MACRO 100 MG PO CAPS: ORAL_CAPSULE | ORAL | 0 refills | Status: DC

## 2019-12-29 MED FILL — FLUCONAZOLE 200 MG TABLET: 200 | 2 days supply | Qty: 2 | Fill #0

## 2019-12-29 MED FILL — NITROFURANTOIN MONO-MCR 100: 100 | 7 days supply | Qty: 14 | Fill #0

## 2019-12-29 NOTE — Discharge Instructions (Addendum)
Increase fluid intake. May use non-prescription AZO for about two days, if desired, to decrease urinary discomfort.  If symptoms become significantly worse during the night or over the weekend, proceed to the local emergency room.  

## 2019-12-29 NOTE — ED Triage Notes (Signed)
Patient presents to Urgent Care with complaints of hematuria since last night. Patient reports she works long hours and does not get to go to the bathroom frequently at work and is worried she might have a UTI.

## 2019-12-29 NOTE — ED Provider Notes (Signed)
[facility]    CSN: [id number] Arrival date & time: 12/29/19  1210      History   Chief Complaint Chief Complaint  Patient presents with  . Appointment    1200  . Urinary Tract Infection    HPI Theresa Wheeler is a 29 y.o. female.   Patient complains of dysuria for four days.  Last night she noticed specks of blood in her urine.  She denies pelvic/abdominal pain.  Last menstrual period was July 20.   Dysuria Pain quality:  Burning Pain severity:  Mild Onset quality:  Sudden Duration:  4 days Timing:  Constant Progression:  Worsening Chronicity:  New Recent urinary tract infections: no   Relieved by:  None tried Worsened by:  Nothing Ineffective treatments:  None tried Urinary symptoms: frequent urination, hematuria and hesitancy   Urinary symptoms: no discolored urine, no foul-smelling urine and no bladder incontinence   Associated symptoms: no abdominal pain, no fever, no flank pain, no genital lesions, no nausea, no vaginal discharge and no vomiting   Risk factors: no recurrent urinary tract infections     Past Medical History:  Diagnosis Date  . Abnormal Pap smear of cervix    2018  . Anemia   . Anxiety   . HSV-1 (herpes simplex virus 1) infection   . STD (sexually transmitted disease)    HPV    Patient Active Problem List   Diagnosis Date Noted  . Anxiety 12/30/2017  . BMI 37.0-37.9, adult 12/30/2017  . History of PCR DNA positive for HSV1 12/30/2017    Past Surgical History:  Procedure Laterality Date  . BREAST REDUCTION SURGERY  2011  . CHOLECYSTECTOMY    . COLPOSCOPY  2018    OB History    Gravida  0   Para  0   Term  0   Preterm  0   AB  0   Living  0     SAB  0   TAB  0   Ectopic  0   Multiple  0   Live Births  0            Home Medications    Prior to Admission medications   Medication Sig Start Date End Date Taking? Authorizing Provider  ALPRAZolam Prudy Feeler) 0.5 MG tablet Take 0.5-1 tablets  (0.25-0.5 mg total) by mouth 2 (two) times daily as needed for anxiety. 10/02/19   Tollie Eth, NP  ibuprofen (ADVIL) 800 MG tablet Take 800 mg by mouth as needed.    [provider]  nitrofurantoin, macrocrystal-monohydrate, (MACROBID) 100 MG capsule Take one cap PO Q12hr with food. 12/29/19   Lattie Haw, MD  phentermine (ADIPEX-P) 37.5 MG tablet Take 1 tablet (37.5 mg total) by mouth daily before breakfast. 11/07/19   Sunnie Nielsen, DO  valACYclovir (VALTREX) 1000 MG tablet Take 1 tablet (1,000 mg total) by mouth daily. 10/02/19   Tollie Eth, NP    Family History Family History  Problem Relation Age of Onset  . Skin cancer Mother   . Hypertension Father   . Diabetes Maternal Grandmother   . Stroke Maternal Grandmother   . Diabetes Maternal Grandfather   . Stroke Maternal Grandfather     Social History Social History   Tobacco Use  . Smoking status: Never Smoker  . Smokeless tobacco: Never Used  Vaping Use  . Vaping Use: Never used  Substance Use Topics  . Alcohol use: Yes    Comment: 3-4/week  .  Drug use: Never     Allergies   Patient has no known allergies.   Review of Systems Review of Systems  Constitutional: Negative for chills, diaphoresis, fatigue and fever.  Gastrointestinal: Negative for abdominal pain, nausea and vomiting.  Genitourinary: Positive for dysuria, frequency and hematuria. Negative for flank pain, pelvic pain, urgency and vaginal discharge.  All other systems reviewed and are negative.    Physical Exam Triage Vital Signs ED Triage Vitals  Enc Vitals Group     BP 12/29/19 1223 134/89     Pulse Rate 12/29/19 1223 80     Resp 12/29/19 1223 16     Temp 12/29/19 1223 98.3 F (36.8 C)     Temp Source 12/29/19 1223 Oral     SpO2 12/29/19 1223 99 %     Weight --      Height --      Head Circumference --      Peak Flow --      Pain Score 12/29/19 1220 0     Pain Loc --      Pain Edu? --      Excl. in GC? --    No data  found.  Updated Vital Signs BP 134/89 (BP Location: Left Arm)   Pulse 80   Temp 98.3 F (36.8 C) (Oral)   Resp 16   SpO2 99%   Visual Acuity Right Eye Distance:   Left Eye Distance:   Bilateral Distance:    Right Eye Near:   Left Eye Near:    Bilateral Near:     Physical Exam Nursing notes and Vital Signs reviewed. Appearance:  Patient appears stated age, and in no acute distress.    Eyes:  Pupils are equal, round, and reactive to light and accomodation.  Extraocular movement is intact.  Conjunctivae are not inflamed   Pharynx:  Normal; moist mucous membranes  Neck:  Supple.  No adenopathy Lungs:  Clear to auscultation.  Breath sounds are equal.  Moving air well. Heart:  Regular rate and rhythm without murmurs, rubs, or gallops.  Abdomen:  Nontender without masses or hepatosplenomegaly.  Bowel sounds are present.  No CVA or flank tenderness.  Extremities:  No edema.  Skin:  No rash present.     UC Treatments / Results  Labs (all labs ordered are listed, but only abnormal results are displayed) Labs Reviewed  POCT URINALYSIS DIP (MANUAL ENTRY) - Abnormal; Notable for the following components:      Result Value   Color, UA straw (*)    Clarity, UA hazy (*)    Blood, UA moderate (*)    Protein Ur, POC trace (*)    Leukocytes, UA Trace (*)    All other components within normal limits  URINE CULTURE    EKG   Radiology No results found.  Procedures Procedures (including critical care time)  Medications Ordered in UC Medications - No data to display  Initial Impression / Assessment and Plan / UC Course  I have reviewed the triage vital signs and the nursing notes.  Pertinent labs & imaging results that were available during my care of the patient were reviewed by me and considered in my medical decision making (see chart for details).    Suspect cystitis.  Urine culture pending.  Begin empiric Macrobid. Followup with Family Doctor if not improved in one week.     Final Clinical Impressions(s) / UC Diagnoses   Final diagnoses:  Gross hematuria  Discharge Instructions     Increase fluid intake. May use non-prescription AZO for about two days, if desired, to decrease urinary discomfort.   If symptoms become significantly worse during the night or over the weekend, proceed to the local emergency room.     ED Prescriptions    Medication Sig Dispense Auth. Provider   nitrofurantoin, macrocrystal-monohydrate, (MACROBID) 100 MG capsule Take one cap PO Q12hr with food. 14 capsule Lattie Haw, MD        Lattie Haw, MD 01/01/20 1056

## 2019-12-29 NOTE — Telephone Encounter (Signed)
2 doses of diflucan called into pharmacy.

## 2019-12-30 LAB — URINE CULTURE
MICRO NUMBER:: 10796745
SPECIMEN QUALITY:: ADEQUATE

## 2020-01-01 MED FILL — valACYclovir HCL 1 GM TABS: 1 | 90 days supply | Qty: 90 | Fill #1

## 2020-01-01 MED FILL — ALPRAZolam 0.5 MG TABS: 0.5 | 15 days supply | Qty: 30 | Fill #1

## 2020-01-18 ENCOUNTER — Encounter: Payer: Self-pay | Admitting: Nurse Practitioner

## 2020-01-18 ENCOUNTER — Ambulatory Visit (INDEPENDENT_AMBULATORY_CARE_PROVIDER_SITE_OTHER): Payer: 59 | Admitting: Nurse Practitioner

## 2020-01-18 VITALS — BP 125/65 | HR 94 | Temp 97.9°F | Ht 63.75 in | Wt 186.1 lb

## 2020-01-18 DIAGNOSIS — Z713 Dietary counseling and surveillance: Secondary | ICD-10-CM | POA: Diagnosis not present

## 2020-01-18 DIAGNOSIS — Z111 Encounter for screening for respiratory tuberculosis: Secondary | ICD-10-CM | POA: Diagnosis not present

## 2020-01-18 DIAGNOSIS — Z6834 Body mass index (BMI) 34.0-34.9, adult: Secondary | ICD-10-CM | POA: Diagnosis not present

## 2020-01-18 MED ORDER — WEGOVY 0.25 MG/0.5ML ~~LOC~~ SOAJ: 0.2500 mg | SUBCUTANEOUS | 0 refills | Status: DC

## 2020-01-18 MED ORDER — PHENTERMINE HCL 37.5 MG PO TABS: 37.5000 mg | ORAL_TABLET | Freq: Every day | ORAL | 0 refills | Status: DC

## 2020-01-18 MED FILL — PHENTERMINE 37.5 MG TABLET: 37.5 | 30 days supply | Qty: 30 | Fill #0

## 2020-01-18 NOTE — Assessment & Plan Note (Signed)
Successful weight loss noted with phentermine in addition to diet and exercise regimen.  She has already begun to taper phentermine dose.  Plan to continue phentermine for 1 more month and simultaneously begin Wegovy (semaglutide) for additional weight loss measures.  Coupon code and instructions provided along with patient information for [facility].  Patient instructed to contact me via MyChart when she has completed her second injection of Wegovy to obtain refill for next dose.  Will plan to follow-up in approximately 3 months to monitor weight loss and medication.  Follow-up sooner if symptoms present.

## 2020-01-18 NOTE — Progress Notes (Signed)
Established Patient Office Visit  Subjective:  Patient ID: Theresa Wheeler, female    DOB: 1991-05-25  Age: 29 y.o. MRN: [id number]  CC:  Chief Complaint  Patient presents with  . Weight Loss    HPI Theresa Wheeler presents for follow-up for weight management.  She has been taking phentermine for the past several months and has been successful in her weight loss.  At last visit it was discussed to taper off of phentermine and transition to a different/sustainable product.  Her weight is down to 186 pounds today.  She denies nervousness, jitteriness, palpitations, difficulty sleeping.  Past Medical History:  Diagnosis Date  . Abnormal Pap smear of cervix    2018  . Anemia   . Anxiety   . HSV-1 (herpes simplex virus 1) infection   . STD (sexually transmitted disease)    HPV    Past Surgical History:  Procedure Laterality Date  . BREAST REDUCTION SURGERY  2011  . CHOLECYSTECTOMY    . COLPOSCOPY  2018    Family History  Problem Relation Age of Onset  . Skin cancer Mother   . Hypertension Father   . Diabetes Maternal Grandmother   . Stroke Maternal Grandmother   . Diabetes Maternal Grandfather   . Stroke Maternal Grandfather     Social History   Socioeconomic History  . Marital status: Single    Spouse name: Not on file  . Number of children: Not on file  . Years of education: Not on file  . Highest education level: Not on file  Occupational History    Employer: [employer]  Tobacco Use  . Smoking status: Never Smoker  . Smokeless tobacco: Never Used  Vaping Use  . Vaping Use: Never used  Substance and Sexual Activity  . Alcohol use: Yes    Comment: 3-4/week  . Drug use: Never  . Sexual activity: Yes    Partners: Male    Birth control/protection: Other-see comments    Comment: partner vasectomy  Other Topics Concern  . Not on file  Social History Narrative  . Not on file   Social Determinants of Health   Financial Resource Strain:   . Difficulty of  Paying Living Expenses: Not on file  Food Insecurity:   . Worried About Programme researcher, broadcasting/film/video in the Last Year: Not on file  . Ran Out of Food in the Last Year: Not on file  Transportation Needs:   . Lack of Transportation (Medical): Not on file  . Lack of Transportation (Non-Medical): Not on file  Physical Activity:   . Days of Exercise per Week: Not on file  . Minutes of Exercise per Session: Not on file  Stress:   . Feeling of Stress : Not on file  Social Connections:   . Frequency of Communication with Friends and Family: Not on file  . Frequency of Social Gatherings with Friends and Family: Not on file  . Attends Religious Services: Not on file  . Active Member of Clubs or Organizations: Not on file  . Attends Banker Meetings: Not on file  . Marital Status: Not on file  Intimate Partner Violence:   . Fear of Current or Ex-Partner: Not on file  . Emotionally Abused: Not on file  . Physically Abused: Not on file  . Sexually Abused: Not on file    Outpatient Medications Prior to Visit  Medication Sig Dispense Refill  . ALPRAZolam (XANAX) 0.5 MG tablet Take 0.5-1 tablets (0.25-0.5  mg total) by mouth 2 (two) times daily as needed for anxiety. 30 tablet 2  . ibuprofen (ADVIL) 800 MG tablet Take 800 mg by mouth as needed.    . valACYclovir (VALTREX) 1000 MG tablet Take 1 tablet (1,000 mg total) by mouth daily. 90 tablet 3  . phentermine (ADIPEX-P) 37.5 MG tablet Take 1 tablet (37.5 mg total) by mouth daily before breakfast. 30 tablet 0  . nitrofurantoin, macrocrystal-monohydrate, (MACROBID) 100 MG capsule Take one cap PO Q12hr with food. (Patient not taking: Reported on 01/18/2020) 14 capsule 0   No facility-administered medications prior to visit.    No Known Allergies    Objective:    Physical Exam Vitals and nursing note reviewed.  Constitutional:      Appearance: Normal appearance. She is normal weight.  HENT:     Head: Normocephalic.  Eyes:      Extraocular Movements: Extraocular movements intact.     Conjunctiva/sclera: Conjunctivae normal.     Pupils: Pupils are equal, round, and reactive to light.  Cardiovascular:     Rate and Rhythm: Normal rate.     Pulses: Normal pulses.  Pulmonary:     Effort: Pulmonary effort is normal.  Musculoskeletal:        General: Normal range of motion.     Cervical back: Normal range of motion.  Skin:    General: Skin is warm and dry.     Capillary Refill: Capillary refill takes less than 2 seconds.  Neurological:     General: No focal deficit present.     Mental Status: She is alert and oriented to person, place, and time.     BP (!) 143/94   Pulse 94   Temp 97.9 F (36.6 C) (Oral)   Ht 5' 3.75" (1.619 m)   Wt 186 lb 1.6 oz (84.4 kg)   SpO2 99%   BMI 32.20 kg/m  Wt Readings from Last 3 Encounters:  01/18/20 186 lb 1.6 oz (84.4 kg)  11/07/19 193 lb (87.5 kg)  10/02/19 201 lb (91.2 kg)     There are no preventive care reminders to display for this patient.  There are no preventive care reminders to display for this patient.  Lab Results  Component Value Date   TSH 2.71 12/29/2017   Lab Results  Component Value Date   WBC 8.0 12/29/2017   HGB 12.9 12/29/2017   HCT 38.5 12/29/2017   MCV 80.9 12/29/2017   PLT 360 12/29/2017   Lab Results  Component Value Date   NA 138 12/29/2017   K 4.2 12/29/2017   CO2 23 12/29/2017   GLUCOSE 88 12/29/2017   BUN 11 12/29/2017   CREATININE 0.86 12/29/2017   BILITOT 0.5 12/29/2017   AST 15 12/29/2017   ALT 15 12/29/2017   PROT 7.8 12/29/2017   CALCIUM 9.6 12/29/2017   Lab Results  Component Value Date   CHOL 216 (H) 12/29/2017   Lab Results  Component Value Date   HDL 56 12/29/2017   Lab Results  Component Value Date   LDLCALC 133 (H) 12/29/2017   Lab Results  Component Value Date   TRIG 145 12/29/2017   Lab Results  Component Value Date   CHOLHDL 3.9 12/29/2017   No results found for: HGBA1C    Assessment &  Plan:   Problem List Items Addressed This Visit      Other   Weight loss counseling, encounter for - Primary    Successful weight loss noted with phentermine  in addition to diet and exercise regimen.  She has already begun to taper phentermine dose.  Plan to continue phentermine for 1 more month and simultaneously begin Wegovy (semaglutide) for additional weight loss measures.  Coupon code and instructions provided along with patient information for [facility].  Patient instructed to contact me via MyChart when she has completed her second injection of Wegovy to obtain refill for next dose.  Will plan to follow-up in approximately 3 months to monitor weight loss and medication.  Follow-up sooner if symptoms present.       Relevant Medications   phentermine (ADIPEX-P) 37.5 MG tablet   Semaglutide-Weight Management (WEGOVY) 0.25 MG/0.5ML SOAJ   Screening for tuberculosis    PPD provided today.       Relevant Orders   TB Skin Test (Completed)      Meds ordered this encounter  Medications  . phentermine (ADIPEX-P) 37.5 MG tablet    Sig: Take 1 tablet (37.5 mg total) by mouth daily before breakfast.    Dispense:  30 tablet    Refill:  0  . Semaglutide-Weight Management (WEGOVY) 0.25 MG/0.5ML SOAJ    Sig: Inject 0.5 mLs (0.25 mg total) into the skin once a week.    Dispense:  2 mL    Refill:  0    Follow-up: Return in about 3 months (around 04/19/2020) for [facility].    Tollie Eth, NP

## 2020-01-18 NOTE — Patient Instructions (Addendum)
Semaglutide injection solution What is this medicine? SEMAGLUTIDE (Sem a GLOO tide) is used to improve blood sugar control in adults with type 2 diabetes. This medicine may be used with other diabetes medicines. This drug may also reduce the risk of heart attack or stroke if you have type 2 diabetes and risk factors for heart disease. This medicine may be used for other purposes; ask your health care provider or pharmacist if you have questions. COMMON BRAND NAME(S): OZEMPIC What should I tell my health care provider before I take this medicine? They need to know if you have any of these conditions:  endocrine tumors (MEN 2) or if someone in your family had these tumors  eye disease, vision problems  history of pancreatitis  kidney disease  stomach problems  thyroid cancer or if someone in your family had thyroid cancer  an unusual or allergic reaction to semaglutide, other medicines, foods, dyes, or preservatives  pregnant or trying to get pregnant  breast-feeding How should I use this medicine? This medicine is for injection under the skin of your upper leg (thigh), stomach area, or upper arm. It is given once every week (every 7 days). You will be taught how to prepare and give this medicine. Use exactly as directed. Take your medicine at regular intervals. Do not take it more often than directed. If you use this medicine with insulin, you should inject this medicine and the insulin separately. Do not mix them together. Do not give the injections right next to each other. Change (rotate) injection sites with each injection. It is important that you put your used needles and syringes in a special sharps container. Do not put them in a trash can. If you do not have a sharps container, call your pharmacist or healthcare provider to get one. A special MedGuide will be given to you by the pharmacist with each prescription and refill. Be sure to read this information carefully  each time. This drug comes with INSTRUCTIONS FOR USE. Ask your pharmacist for directions on how to use this drug. Read the information carefully. Talk to your pharmacist or health care provider if you have questions. Talk to your pediatrician regarding the use of this medicine in children. Special care may be needed. Overdosage: If you think you have taken too much of this medicine contact a poison control center or emergency room at once. NOTE: This medicine is only for you. Do not share this medicine with others. What if I miss a dose? If you miss a dose, take it as soon as you can within 5 days after the missed dose. Then take your next dose at your regular weekly time. If it has been longer than 5 days after the missed dose, do not take the missed dose. Take the next dose at your regular time. Do not take double or extra doses. If you have questions about a missed dose, contact your health care provider for advice. What may interact with this medicine?  other medicines for diabetes Many medications may cause changes in blood sugar, these include:  alcohol containing beverages  antiviral medicines for HIV or AIDS  aspirin and aspirin-like drugs  certain medicines for blood pressure, heart disease, irregular heart beat  chromium  diuretics  female hormones, such as estrogens or progestins, birth control pills  fenofibrate  gemfibrozil  isoniazid  lanreotide  female hormones or anabolic steroids  MAOIs like Carbex, Eldepryl, Marplan, Nardil, and Parnate  medicines for weight  loss  medicines for allergies, asthma, cold, or cough  medicines for depression, anxiety, or psychotic disturbances  niacin  nicotine  NSAIDs, medicines for pain and inflammation, like ibuprofen or naproxen  octreotide  pasireotide  pentamidine  phenytoin  probenecid  quinolone antibiotics such as ciprofloxacin, levofloxacin, ofloxacin  some herbal dietary supplements  steroid  medicines such as prednisone or cortisone  sulfamethoxazole; trimethoprim  thyroid hormones Some medications can hide the warning symptoms of low blood sugar (hypoglycemia). You may need to monitor your blood sugar more closely if you are taking one of these medications. These include:  beta-blockers, often used for high blood pressure or heart problems (examples include atenolol, metoprolol, propranolol)  clonidine  guanethidine  reserpine This list may not describe all possible interactions. Give your health care provider a list of all the medicines, herbs, non-prescription drugs, or dietary supplements you use. Also tell them if you smoke, drink alcohol, or use illegal drugs. Some items may interact with your medicine. What should I watch for while using this medicine? Visit your doctor or health care professional for regular checks on your progress. Drink plenty of fluids while taking this medicine. Check with your doctor or health care professional if you get an attack of severe diarrhea, nausea, and vomiting. The loss of too much body fluid can make it dangerous for you to take this medicine. A test called the HbA1C (A1C) will be monitored. This is a simple blood test. It measures your blood sugar control over the last 2 to 3 months. You will receive this test every 3 to 6 months. Learn how to check your blood sugar. Learn the symptoms of low and high blood sugar and how to manage them. Always carry a quick-source of sugar with you in case you have symptoms of low blood sugar. Examples include hard sugar candy or glucose tablets. Make sure others know that you can choke if you eat or drink when you develop serious symptoms of low blood sugar, such as seizures or unconsciousness. They must get medical help at once. Tell your doctor or health care professional if you have high blood sugar. You might need to change the dose of your medicine. If you are sick or exercising more than usual, you  might need to change the dose of your medicine. Do not skip meals. Ask your doctor or health care professional if you should avoid alcohol. Many nonprescription cough and cold products contain sugar or alcohol. These can affect blood sugar. Pens should never be shared. Even if the needle is changed, sharing may result in passing of viruses like hepatitis or HIV. Wear a medical ID bracelet or chain, and carry a card that describes your disease and details of your medicine and dosage times. Do not become pregnant while taking this medicine. Women should inform their doctor if they wish to become pregnant or think they might be pregnant. There is a potential for serious side effects to an unborn child. Talk to your health care professional or pharmacist for more information. What side effects may I notice from receiving this medicine? Side effects that you should report to your doctor or health care professional as soon as possible:  allergic reactions like skin rash, itching or hives, swelling of the face, lips, or tongue  breathing problems  changes in vision  diarrhea that continues or is severe  lump or swelling on the neck  severe nausea  signs and symptoms of infection like fever or chills; cough; sore  throat; pain or trouble passing urine  signs and symptoms of low blood sugar such as feeling anxious, confusion, dizziness, increased hunger, unusually weak or tired, sweating, shakiness, cold, irritable, headache, blurred vision, fast heartbeat, loss of consciousness  signs and symptoms of kidney injury like trouble passing urine or change in the amount of urine  trouble swallowing  unusual stomach upset or pain  vomiting Side effects that usually do not require medical attention (report to your doctor or health care professional if they continue or are bothersome):  constipation  diarrhea  nausea  pain, redness, or irritation at site where injected  stomach upset This  list may not describe all possible side effects. Call your doctor for medical advice about side effects. You may report side effects to FDA at 1-800-FDA-1088. Where should I keep my medicine? Keep out of the reach of children. Store unopened pens in a refrigerator between 2 and 8 degrees C (36 and 46 degrees F). Do not freeze. Protect from light and heat. After you first use the pen, it can be stored for 56 days at room temperature between 15 and 30 degrees C (59 and 86 degrees F) or in a refrigerator. Throw away your used pen after 56 days or after the expiration date, whichever comes first. Do not store your pen with the needle attached. If the needle is left on, medicine may leak from the pen. NOTE: This sheet is a summary. It may not cover all possible information. If you have questions about this medicine, talk to your doctor, pharmacist, or health care provider.  2020 Elsevier/Gold Standard (2019-01-24 09:41:51) Phentermine tablets or capsules What is this medicine? PHENTERMINE (FEN ter meen) decreases your appetite. It is used with a reduced calorie diet and exercise to help you lose weight. This medicine may be used for other purposes; ask your health care provider or pharmacist if you have questions. COMMON BRAND NAME(S): Adipex-P, Atti-Plex P, Atti-Plex P Spansule, Fastin, Lomaira, Pro-Fast, Tara-8 What should I tell my health care provider before I take this medicine? They need to know if you have any of these conditions:  agitation or nervousness  diabetes  glaucoma  heart disease  high blood pressure  history of drug abuse or addiction  history of stroke  kidney disease  lung disease called Primary Pulmonary Hypertension (PPH)  taken an MAOI like Carbex, Eldepryl, Marplan, Nardil, or Parnate in last 14 days  taking stimulant medicines for attention disorders, weight loss, or to stay awake  thyroid disease  an unusual or allergic reaction to phentermine, other  medicines, foods, dyes, or preservatives  pregnant or trying to get pregnant  breast-feeding How should I use this medicine? Take this medicine by mouth with a glass of water. Follow the directions on the prescription label. Take your medicine at regular intervals. Do not take it more often than directed. Do not stop taking except on your doctor's advice. Talk to your pediatrician regarding the use of this medicine in children. While this drug may be prescribed for children 17 years or older for selected conditions, precautions do apply. Overdosage: If you think you have taken too much of this medicine contact a poison control center or emergency room at once. NOTE: This medicine is only for you. Do not share this medicine with others. What if I miss a dose? If you miss a dose, take it as soon as you can. If it is almost time for your next dose, take only that dose. Do not  take double or extra doses. What may interact with this medicine? Do not take this medicine with any of the following medications:  MAOIs like Carbex, Eldepryl, Marplan, Nardil, and Parnate This medicine may also interact with the following medications:  alcohol  certain medicines for depression, anxiety, or psychotic disorders  certain medicines for high blood pressure  linezolid  medicines for colds or breathing difficulties like pseudoephedrine or phenylephrine  medicines for diabetes  sibutramine  stimulant medicines for attention disorders, weight loss, or to stay awake This list may not describe all possible interactions. Give your health care provider a list of all the medicines, herbs, non-prescription drugs, or dietary supplements you use. Also tell them if you smoke, drink alcohol, or use illegal drugs. Some items may interact with your medicine. What should I watch for while using this medicine? Visit your doctor or health care provider for regular checks on your progress. Do not stop taking except on  your health care provider's advice. You may develop a severe reaction. Your health care provider will tell you how much medicine to take. Do not take this medicine close to bedtime. It may prevent you from sleeping. You may get drowsy or dizzy. Do not drive, use machinery, or do anything that needs mental alertness until you know how this medicine affects you. Do not stand or sit up quickly, especially if you are an older patient. This reduces the risk of dizzy or fainting spells. Alcohol may increase dizziness and drowsiness. Avoid alcoholic drinks. This medicine may affect blood sugar levels. Ask your healthcare provider if changes in diet or medicines are needed if you have diabetes. Women should inform their health care provider if they wish to become pregnant or think they might be pregnant. Losing weight while pregnant is not advised and may cause harm to the unborn child. Talk to your health care provider for more information. What side effects may I notice from receiving this medicine? Side effects that you should report to your doctor or health care professional as soon as possible:  allergic reactions like skin rash, itching or hives, swelling of the face, lips, or tongue  breathing problems  changes in emotions or moods  changes in vision  chest pain or chest tightness  fast, irregular heartbeat  feeling faint or lightheaded  increased blood pressure  irritable  restlessness  tremors  seizures  signs and symptoms of a stroke like changes in vision; confusion; trouble speaking or understanding; severe headaches; sudden numbness or weakness of the face, arm or leg; trouble walking; dizziness; loss of balance or coordination  unusually weak or tired Side effects that usually do not require medical attention (report to your doctor or health care professional if they continue or are bothersome):  changes in taste  constipation or diarrhea  dizziness  dry  mouth  headache  trouble sleeping  upset stomach This list may not describe all possible side effects. Call your doctor for medical advice about side effects. You may report side effects to FDA at 1-800-FDA-1088. Where should I keep my medicine? Keep out of the reach of children. This medicine can be abused. Keep your medicine in a safe place to protect it from theft. Do not share this medicine with anyone. Selling or giving away this medicine is dangerous and against the law. This medicine may cause harm and death if it is taken by other adults, children, or pets. Return medicine that has not been used to an official disposal site. Contact  the DEA at 762 233 8781 or your city/county government to find a site. If you cannot return the medicine, mix any unused medicine with a substance like cat litter or coffee grounds. Then throw the medicine away in a sealed container like a sealed bag or coffee can with a lid. Do not use the medicine after the expiration date. Store at room temperature between 20 and 25 degrees C (68 and 77 degrees F). Keep container tightly closed. NOTE: This sheet is a summary. It may not cover all possible information. If you have questions about this medicine, talk to your doctor, pharmacist, or health care provider.  2020 Elsevier/Gold Standard (2019-03-17 12:54:20)

## 2020-01-18 NOTE — Assessment & Plan Note (Signed)
PPD provided today.

## 2020-01-19 MED FILL — WEGOVY 0.25 MG/0.5ML SOAJ: 0.25 | 28 days supply | Qty: 2 | Fill #0

## 2020-02-19 ENCOUNTER — Encounter: Payer: Self-pay | Admitting: Nurse Practitioner

## 2020-02-19 ENCOUNTER — Other Ambulatory Visit: Payer: Self-pay | Admitting: Nurse Practitioner

## 2020-02-19 DIAGNOSIS — Z713 Dietary counseling and surveillance: Secondary | ICD-10-CM

## 2020-02-19 MED ORDER — SEMAGLUTIDE-WEIGHT MANAGEMENT 1 MG/0.5ML ~~LOC~~ SOAJ: 1.0000 mg | SUBCUTANEOUS | 0 refills | Status: DC

## 2020-02-19 MED ORDER — ONDANSETRON HCL 8 MG PO TABS: 8.0000 mg | ORAL_TABLET | Freq: Three times a day (TID) | ORAL | 2 refills | Status: DC | PRN

## 2020-02-20 ENCOUNTER — Other Ambulatory Visit: Payer: Self-pay | Admitting: Nurse Practitioner

## 2020-02-20 DIAGNOSIS — U071 COVID-19: Secondary | ICD-10-CM

## 2020-02-20 DIAGNOSIS — Z6834 Body mass index (BMI) 34.0-34.9, adult: Secondary | ICD-10-CM

## 2020-02-20 MED FILL — ONDANSETRON HCL 8 MG TABLET: 8 | 20 days supply | Qty: 60 | Fill #0

## 2020-02-27 MED FILL — WEGOVY 1 MG/0.5ML SOAJ: 1 | 28 days supply | Qty: 2 | Fill #0

## 2020-03-05 ENCOUNTER — Other Ambulatory Visit: Payer: Self-pay | Admitting: Nurse Practitioner

## 2020-03-05 DIAGNOSIS — R112 Nausea with vomiting, unspecified: Secondary | ICD-10-CM

## 2020-03-05 MED ORDER — PROMETHAZINE HCL 25 MG PO TABS: 25.0000 mg | ORAL_TABLET | Freq: Four times a day (QID) | ORAL | 2 refills | Status: DC | PRN

## 2020-03-05 NOTE — Progress Notes (Signed)
Script provided for phenergan due to nausea and vomiting.

## 2020-03-06 ENCOUNTER — Other Ambulatory Visit (HOSPITAL_COMMUNITY): Payer: Self-pay | Admitting: Nurse Practitioner

## 2020-03-06 DIAGNOSIS — R112 Nausea with vomiting, unspecified: Secondary | ICD-10-CM

## 2020-03-06 MED ORDER — PROMETHAZINE HCL 25 MG PO TABS: 25.0000 mg | ORAL_TABLET | Freq: Four times a day (QID) | ORAL | 2 refills | Status: DC | PRN

## 2020-04-02 ENCOUNTER — Other Ambulatory Visit: Payer: Self-pay | Admitting: Nurse Practitioner

## 2020-04-02 ENCOUNTER — Encounter: Payer: Self-pay | Admitting: Nurse Practitioner

## 2020-04-02 DIAGNOSIS — B9689 Other specified bacterial agents as the cause of diseases classified elsewhere: Secondary | ICD-10-CM

## 2020-04-02 MED ORDER — METRONIDAZOLE 500 MG PO TABS: 500.0000 mg | ORAL_TABLET | Freq: Two times a day (BID) | ORAL | 0 refills | Status: DC

## 2020-04-02 MED ORDER — FLUCONAZOLE 150 MG PO TABS: 150.0000 mg | ORAL_TABLET | Freq: Once | ORAL | 1 refills | Status: AC

## 2020-04-10 NOTE — Telephone Encounter (Signed)
This encounter was created in error - please disregard.

## 2020-04-14 ENCOUNTER — Encounter: Payer: Self-pay | Admitting: Nurse Practitioner

## 2020-04-15 ENCOUNTER — Encounter: Payer: Self-pay | Admitting: Osteopathic Medicine

## 2020-04-15 ENCOUNTER — Ambulatory Visit: Payer: 59 | Admitting: Nurse Practitioner

## 2020-04-15 ENCOUNTER — Other Ambulatory Visit: Payer: Self-pay

## 2020-04-15 ENCOUNTER — Ambulatory Visit (INDEPENDENT_AMBULATORY_CARE_PROVIDER_SITE_OTHER): Payer: 59 | Admitting: Osteopathic Medicine

## 2020-04-15 DIAGNOSIS — S8992XA Unspecified injury of left lower leg, initial encounter: Secondary | ICD-10-CM

## 2020-04-15 DIAGNOSIS — S83242A Other tear of medial meniscus, current injury, left knee, initial encounter: Secondary | ICD-10-CM | POA: Insufficient documentation

## 2020-04-15 MED ORDER — TRIAZOLAM 0.25 MG PO TABS: ORAL_TABLET | ORAL | 0 refills | Status: DC

## 2020-04-15 MED ORDER — OXYCODONE-ACETAMINOPHEN 5-325 MG PO TABS: 1.0000 | ORAL_TABLET | Freq: Three times a day (TID) | ORAL | 0 refills | Status: DC | PRN
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
## 2020-04-15 NOTE — Patient Instructions (Signed)
Staple removal 7-10 days after placed
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
## 2020-04-15 NOTE — Progress Notes (Signed)
Consulted sports medicine - see Dr T consult note - asked him to assess knee today. Pt reports otherwise pain medications are working well. Concussion precautions/management reviewed. Work note provided.

## 2020-04-16 ENCOUNTER — Encounter: Payer: Self-pay | Admitting: Sports Medicine

## 2020-04-19 ENCOUNTER — Encounter: Payer: Self-pay | Admitting: Osteopathic Medicine

## 2020-04-19 DIAGNOSIS — Z713 Dietary counseling and surveillance: Secondary | ICD-10-CM

## 2020-04-20 ENCOUNTER — Ambulatory Visit (INDEPENDENT_AMBULATORY_CARE_PROVIDER_SITE_OTHER): Payer: 59

## 2020-04-20 ENCOUNTER — Other Ambulatory Visit: Payer: Self-pay

## 2020-04-20 DIAGNOSIS — S83242A Other tear of medial meniscus, current injury, left knee, initial encounter: Secondary | ICD-10-CM

## 2020-04-22 ENCOUNTER — Ambulatory Visit: Payer: Self-pay | Admitting: Osteopathic Medicine

## 2020-04-22 MED ORDER — SEMAGLUTIDE-WEIGHT MANAGEMENT 1 MG/0.5ML ~~LOC~~ SOAJ: 1.0000 mg | SUBCUTANEOUS | 0 refills | Status: DC
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
## 2020-04-29 ENCOUNTER — Encounter: Payer: Self-pay | Admitting: Nurse Practitioner

## 2020-04-29 ENCOUNTER — Other Ambulatory Visit: Payer: Self-pay

## 2020-04-29 ENCOUNTER — Ambulatory Visit: Payer: 59 | Admitting: Nurse Practitioner

## 2020-04-29 VITALS — BP 108/64 | HR 70 | Resp 16 | Ht 64.0 in | Wt 182.0 lb

## 2020-04-29 DIAGNOSIS — Z7721 Contact with and (suspected) exposure to potentially hazardous body fluids: Secondary | ICD-10-CM | POA: Diagnosis not present

## 2020-04-29 DIAGNOSIS — R238 Other skin changes: Secondary | ICD-10-CM | POA: Diagnosis not present

## 2020-04-29 DIAGNOSIS — Z01419 Encounter for gynecological examination (general) (routine) without abnormal findings: Secondary | ICD-10-CM | POA: Diagnosis not present

## 2020-04-29 NOTE — Patient Instructions (Signed)
Health Maintenance, Female Adopting a healthy lifestyle and getting preventive care are important in promoting health and wellness. Ask your health care provider about:  The right schedule for you to have regular tests and exams.  Things you can do on your own to prevent diseases and keep yourself healthy. What should I know about diet, weight, and exercise? Eat a healthy diet   Eat a diet that includes plenty of vegetables, fruits, low-fat dairy products, and lean protein.  Do not eat a lot of foods that are high in solid fats, added sugars, or sodium. Maintain a healthy weight Body mass index (BMI) is used to identify weight problems. It estimates body fat based on height and weight. Your health care provider can help determine your BMI and help you achieve or maintain a healthy weight. Get regular exercise Get regular exercise. This is one of the most important things you can do for your health. Most adults should:  Exercise for at least 150 minutes each week. The exercise should increase your heart rate and make you sweat (moderate-intensity exercise).  Do strengthening exercises at least twice a week. This is in addition to the moderate-intensity exercise.  Spend less time sitting. Even light physical activity can be beneficial. Watch cholesterol and blood lipids Have your blood tested for lipids and cholesterol at 29 years of age, then have this test every 5 years. Have your cholesterol levels checked more often if:  Your lipid or cholesterol levels are high.  You are older than 29 years of age.  You are at high risk for heart disease. What should I know about cancer screening? Depending on your health history and family history, you may need to have cancer screening at various ages. This may include screening for:  Breast cancer.  Cervical cancer.  Colorectal cancer.  Skin cancer.  Lung cancer. What should I know about heart disease, diabetes, and high blood  pressure? Blood pressure and heart disease  High blood pressure causes heart disease and increases the risk of stroke. This is more likely to develop in people who have high blood pressure readings, are of African descent, or are overweight.  Have your blood pressure checked: ? Every 3-5 years if you are 18-39 years of age. ? Every year if you are 40 years old or older. Diabetes Have regular diabetes screenings. This checks your fasting blood sugar level. Have the screening done:  Once every three years after age 40 if you are at a normal weight and have a low risk for diabetes.  More often and at a younger age if you are overweight or have a high risk for diabetes. What should I know about preventing infection? Hepatitis B If you have a higher risk for hepatitis B, you should be screened for this virus. Talk with your health care provider to find out if you are at risk for hepatitis B infection. Hepatitis C Testing is recommended for:  Everyone born from 1945 through 1965.  Anyone with known risk factors for hepatitis C. Sexually transmitted infections (STIs)  Get screened for STIs, including gonorrhea and chlamydia, if: ? You are sexually active and are younger than 29 years of age. ? You are older than 29 years of age and your health care provider tells you that you are at risk for this type of infection. ? Your sexual activity has changed since you were last screened, and you are at increased risk for chlamydia or gonorrhea. Ask your health care provider if   you are at risk.  Ask your health care provider about whether you are at high risk for HIV. Your health care provider may recommend a prescription medicine to help prevent HIV infection. If you choose to take medicine to prevent HIV, you should first get tested for HIV. You should then be tested every 3 months for as long as you are taking the medicine. Pregnancy  If you are about to stop having your period (premenopausal) and  you may become pregnant, seek counseling before you get pregnant.  Take 400 to 800 micrograms (mcg) of folic acid every day if you become pregnant.  Ask for birth control (contraception) if you want to prevent pregnancy. Osteoporosis and menopause Osteoporosis is a disease in which the bones lose minerals and strength with aging. This can result in bone fractures. If you are 65 years old or older, or if you are at risk for osteoporosis and fractures, ask your health care provider if you should:  Be screened for bone loss.  Take a calcium or vitamin D supplement to lower your risk of fractures.  Be given hormone replacement therapy (HRT) to treat symptoms of menopause. Follow these instructions at home: Lifestyle  Do not use any products that contain nicotine or tobacco, such as cigarettes, e-cigarettes, and chewing tobacco. If you need help quitting, ask your health care provider.  Do not use street drugs.  Do not share needles.  Ask your health care provider for help if you need support or information about quitting drugs. Alcohol use  Do not drink alcohol if: ? Your health care provider tells you not to drink. ? You are pregnant, may be pregnant, or are planning to become pregnant.  If you drink alcohol: ? Limit how much you use to 0-1 drink a day. ? Limit intake if you are breastfeeding.  Be aware of how much alcohol is in your drink. In the [place] one drink equals one 12 oz bottle of beer (355 mL), one 5 oz glass of wine (148 mL), or one 1 oz glass of hard liquor (44 mL). General instructions  Schedule regular health, dental, and eye exams.  Stay current with your vaccines.  Tell your health care provider if: ? You often feel depressed. ? You have ever been abused or do not feel safe at home. Summary  Adopting a healthy lifestyle and getting preventive care are important in promoting health and wellness.  Follow your health care provider's instructions about healthy  diet, exercising, and getting tested or screened for diseases.  Follow your health care provider's instructions on monitoring your cholesterol and blood pressure. This information is not intended to replace advice given to you by your health care provider. Make sure you discuss any questions you have with your health care provider. Document Revised: 05/04/2018 Document Reviewed: 05/04/2018 Elsevier Patient Education  2020 Elsevier Inc.  

## 2020-04-30 ENCOUNTER — Other Ambulatory Visit: Payer: Self-pay | Admitting: Nurse Practitioner

## 2020-04-30 DIAGNOSIS — B373 Candidiasis of vulva and vagina: Secondary | ICD-10-CM

## 2020-04-30 DIAGNOSIS — B3731 Acute candidiasis of vulva and vagina: Secondary | ICD-10-CM

## 2020-04-30 LAB — VAGINITIS/VAGINOSIS, DNA PROBE
Candida Species: POSITIVE — AB
Gardnerella vaginalis: NEGATIVE
Trichomonas vaginosis: NEGATIVE

## 2020-04-30 LAB — RPR: RPR Ser Ql: NONREACTIVE

## 2020-04-30 LAB — HIV ANTIBODY (ROUTINE TESTING W REFLEX): HIV Screen 4th Generation wRfx: NONREACTIVE

## 2020-04-30 LAB — HEPATITIS C ANTIBODY: Hep C Virus Ab: <0.1 s/co ratio (ref 0.0–0.9)

## 2020-04-30 MED ORDER — FLUCONAZOLE 150 MG PO TABS: 150.0000 mg | ORAL_TABLET | Freq: Once | ORAL | 1 refills | Status: AC

## 2020-05-06 ENCOUNTER — Telehealth (INDEPENDENT_AMBULATORY_CARE_PROVIDER_SITE_OTHER): Payer: 59 | Admitting: Sports Medicine

## 2020-05-06 DIAGNOSIS — S83222D Peripheral tear of medial meniscus, current injury, left knee, subsequent encounter: Secondary | ICD-10-CM

## 2020-05-06 NOTE — Addendum Note (Signed)
Addended by: Monica Becton on: 05/06/2020 02:13 PM   Modules accepted: Level of Service
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
## 2020-05-06 NOTE — Progress Notes (Signed)
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 On the way to get COVID test now. Have some questions about the MRI/
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
## 2020-05-10 ENCOUNTER — Telehealth: Payer: Self-pay

## 2020-05-10 NOTE — Telephone Encounter (Signed)
Patient is calling in regard to having BV and would like to know if a prescription can be called into pharmacy.

## 2020-05-10 NOTE — Telephone Encounter (Signed)
Spoke with patient. Patient reports grey vaginal d/c with odor that started on 05/08/20. Patient denies any other symptoms. She was last seen in office on 04/29/20, vaginitis testing positive for yeast, tx w/ diflucan 150 mg PO x1. Patient states she was tx for BV in October and then a UTI. Patient is requesting RX for BV.   Advised OV needed for further evaluation, offered OV on 12/20, patient declined. Patient states she will plan to go to urgent care over the weekend. Advised patient if symptoms do not resolve and she desires OV, return call to office. Advised provider will review, our office will f/u if any additional recommendations.   Routing to provider for final review. Patient verbalizes understanding.  Will close encounter.

## 2020-05-20 ENCOUNTER — Other Ambulatory Visit: Payer: Self-pay

## 2020-05-20 ENCOUNTER — Ambulatory Visit (INDEPENDENT_AMBULATORY_CARE_PROVIDER_SITE_OTHER): Payer: 59 | Admitting: Sports Medicine

## 2020-05-20 ENCOUNTER — Ambulatory Visit: Payer: Self-pay

## 2020-05-20 DIAGNOSIS — S83222D Peripheral tear of medial meniscus, current injury, left knee, subsequent encounter: Secondary | ICD-10-CM

## 2020-05-20 NOTE — Assessment & Plan Note (Signed)
Persistent pain, MRI confirms medial meniscal tear, injected as above, return in a month.

## 2020-05-20 NOTE — Progress Notes (Signed)
    Procedures performed today:     Procedure: Real-time Ultrasound Guided injection of the left knee Device: Samsung HS60  Verbal informed consent obtained.  Time-out conducted.  Noted no overlying erythema, induration, or other signs of local infection.  Skin prepped in a sterile fashion.  Local anesthesia: Topical Ethyl chloride.  With sterile technique and under real time ultrasound guidance: 1 cc Kenalog 40, 2 cc lidocaine, 2 cc bupivacaine injected easily Completed without difficulty  Advised to call if fevers/chills, erythema, induration, drainage, or persistent bleeding.  Images permanently stored and available for review in PACS.  Impression: Technically successful ultrasound guided injection.  Independent interpretation of notes and tests performed by another provider:   None.  Brief History, Exam, Impression, and Recommendations:    Tear of medial meniscus of left knee Persistent pain, MRI confirms medial meniscal tear, injected as above, return in a month.    ___________________________________________ Theresa Wheeler. Benjamin Stain, M.D., ABFM., CAQSM. Primary Care and Sports Medicine [facility]  Adjunct Instructor of Family Medicine  [employer]

## 2020-05-22 ENCOUNTER — Encounter: Payer: Self-pay | Admitting: Osteopathic Medicine

## 2020-05-22 DIAGNOSIS — F0781 Postconcussional syndrome: Secondary | ICD-10-CM

## 2020-06-06 ENCOUNTER — Encounter: Payer: Self-pay | Admitting: Osteopathic Medicine

## 2020-06-06 MED ORDER — FLUCONAZOLE 150 MG PO TABS: 150.0000 mg | ORAL_TABLET | Freq: Once | ORAL | 0 refills | Status: AC

## 2020-06-20 ENCOUNTER — Ambulatory Visit (INDEPENDENT_AMBULATORY_CARE_PROVIDER_SITE_OTHER): Payer: 59 | Admitting: Sports Medicine

## 2020-06-20 ENCOUNTER — Other Ambulatory Visit: Payer: Self-pay

## 2020-06-20 ENCOUNTER — Telehealth: Payer: Self-pay | Admitting: *Deleted

## 2020-06-20 DIAGNOSIS — S83222D Peripheral tear of medial meniscus, current injury, left knee, subsequent encounter: Secondary | ICD-10-CM | POA: Diagnosis not present

## 2020-06-20 MED ORDER — OXYCODONE HCL ER 10 MG PO T12A
10.0000 mg | EXTENDED_RELEASE_TABLET | Freq: Two times a day (BID) | ORAL | 0 refills | Status: DC
Start: 2020-06-20 — End: 2020-08-15

## 2020-06-20 NOTE — Telephone Encounter (Signed)
PA submitted through Cover My Meds for Oxycodone ER 10mg . Awaiting response.

## 2020-06-20 NOTE — Assessment & Plan Note (Signed)
This is a pleasant 30 year old female, persistent pain, MRI confirmed medial meniscal tear. We injected at the last visit unfortunately she has not improved. At this point I do think she is a candidate for operative knee arthroscopy. We will do a referral to Dr. Everardo Pacific, refilling Percocet, she is waking up in the middle of the night so we will do extended release.

## 2020-06-20 NOTE — Progress Notes (Signed)
    Procedures performed today:    None.  Independent interpretation of notes and tests performed by another provider:   None.  Brief History, Exam, Impression, and Recommendations:    Tear of medial meniscus of left knee This is a pleasant 30 year old female, persistent pain, MRI confirmed medial meniscal tear. We injected at the last visit unfortunately she has not improved. At this point I do think she is a candidate for operative knee arthroscopy. We will do a referral to Dr. Everardo Pacific, refilling Percocet, she is waking up in the middle of the night so we will do extended release.    ___________________________________________ Ihor Austin. Benjamin Stain, M.D., ABFM., CAQSM. Primary Care and Sports Medicine [facility]  Adjunct Instructor of Family Medicine  [employer]

## 2020-07-02 ENCOUNTER — Other Ambulatory Visit: Payer: Self-pay | Admitting: Osteopathic Medicine

## 2020-07-02 ENCOUNTER — Other Ambulatory Visit: Payer: Self-pay | Admitting: Nurse Practitioner

## 2020-07-02 DIAGNOSIS — F419 Anxiety disorder, unspecified: Secondary | ICD-10-CM

## 2020-07-02 DIAGNOSIS — Z713 Dietary counseling and surveillance: Secondary | ICD-10-CM

## 2020-07-06 ENCOUNTER — Other Ambulatory Visit (HOSPITAL_COMMUNITY): Payer: 59

## 2020-07-10 ENCOUNTER — Ambulatory Visit (HOSPITAL_BASED_OUTPATIENT_CLINIC_OR_DEPARTMENT_OTHER): Admission: RE | Admit: 2020-07-10 | Payer: 59 | Source: Home / Self Care | Admitting: Orthopaedic Surgery

## 2020-07-10 ENCOUNTER — Encounter (HOSPITAL_BASED_OUTPATIENT_CLINIC_OR_DEPARTMENT_OTHER): Admission: RE | Payer: Self-pay | Source: Home / Self Care

## 2020-07-10 SURGERY — ARTHROSCOPY, KNEE, WITH MEDIAL MENISCECTOMY
Anesthesia: Choice | Site: Knee | Laterality: Left

## 2020-07-15 ENCOUNTER — Encounter: Payer: Self-pay | Admitting: Osteopathic Medicine

## 2020-07-15 MED ORDER — WEGOVY 1.7 MG/0.75ML ~~LOC~~ SOAJ: 1.7000 mg | SUBCUTANEOUS | 0 refills | Status: DC

## 2020-07-15 MED ORDER — WEGOVY 2.4 MG/0.75ML ~~LOC~~ SOAJ: 2.4000 mg | SUBCUTANEOUS | 0 refills | Status: DC

## 2020-07-31 ENCOUNTER — Encounter: Payer: Self-pay | Admitting: Osteopathic Medicine

## 2020-08-06 ENCOUNTER — Encounter: Payer: Self-pay | Admitting: Osteopathic Medicine

## 2020-08-06 ENCOUNTER — Telehealth (INDEPENDENT_AMBULATORY_CARE_PROVIDER_SITE_OTHER): Payer: 59 | Admitting: Osteopathic Medicine

## 2020-08-06 VITALS — Wt 178.0 lb

## 2020-08-06 DIAGNOSIS — Z713 Dietary counseling and surveillance: Secondary | ICD-10-CM

## 2020-08-06 DIAGNOSIS — Z6834 Body mass index (BMI) 34.0-34.9, adult: Secondary | ICD-10-CM | POA: Diagnosis not present

## 2020-08-06 DIAGNOSIS — B9689 Other specified bacterial agents as the cause of diseases classified elsewhere: Secondary | ICD-10-CM

## 2020-08-06 DIAGNOSIS — N760 Acute vaginitis: Secondary | ICD-10-CM | POA: Diagnosis not present

## 2020-08-06 MED ORDER — PHENTERMINE-TOPIRAMATE ER 7.5-46 MG PO CP24: 1.0000 | ORAL_CAPSULE | Freq: Every morning | ORAL | 0 refills | Status: DC

## 2020-08-06 MED ORDER — CLINDAMYCIN HCL 300 MG PO CAPS: 300.0000 mg | ORAL_CAPSULE | Freq: Two times a day (BID) | ORAL | 0 refills | Status: AC

## 2020-08-06 MED ORDER — FLUCONAZOLE 150 MG PO TABS: 150.0000 mg | ORAL_TABLET | Freq: Once | ORAL | 1 refills | Status: AC

## 2020-08-06 MED ORDER — PHENTERMINE-TOPIRAMATE ER 3.75-23 MG PO CP24: 1.0000 | ORAL_CAPSULE | Freq: Every morning | ORAL | 0 refills | Status: DC
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
## 2020-08-12 ENCOUNTER — Ambulatory Visit: Payer: 59 | Admitting: Neurology

## 2020-08-12 ENCOUNTER — Telehealth: Payer: Self-pay | Admitting: *Deleted

## 2020-08-12 ENCOUNTER — Encounter: Payer: Self-pay | Admitting: Neurology

## 2020-08-12 NOTE — Telephone Encounter (Signed)
No showed new patient appointment. 

## 2020-08-15 ENCOUNTER — Encounter (HOSPITAL_BASED_OUTPATIENT_CLINIC_OR_DEPARTMENT_OTHER): Payer: Self-pay | Admitting: Orthopaedic Surgery

## 2020-08-15 ENCOUNTER — Other Ambulatory Visit: Payer: Self-pay

## 2020-08-16 NOTE — Progress Notes (Signed)

## 2020-08-19 ENCOUNTER — Other Ambulatory Visit (HOSPITAL_COMMUNITY): Payer: 59
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
## 2020-08-21 ENCOUNTER — Ambulatory Visit (HOSPITAL_BASED_OUTPATIENT_CLINIC_OR_DEPARTMENT_OTHER)
Admission: RE | Admit: 2020-08-21 | Discharge: 2020-08-21 | Disposition: A | Discharge status: Home / Self Care | Payer: 59 | Attending: Orthopaedic Surgery | Admitting: Orthopaedic Surgery

## 2020-08-21 ENCOUNTER — Other Ambulatory Visit: Payer: Self-pay

## 2020-08-21 ENCOUNTER — Encounter (HOSPITAL_BASED_OUTPATIENT_CLINIC_OR_DEPARTMENT_OTHER): Payer: Self-pay | Admitting: Orthopaedic Surgery

## 2020-08-21 ENCOUNTER — Encounter (HOSPITAL_BASED_OUTPATIENT_CLINIC_OR_DEPARTMENT_OTHER)
Admission: RE | Disposition: A | Discharge status: Home / Self Care | Payer: Self-pay | Source: Home / Self Care | Attending: Orthopaedic Surgery

## 2020-08-21 ENCOUNTER — Ambulatory Visit (HOSPITAL_BASED_OUTPATIENT_CLINIC_OR_DEPARTMENT_OTHER): Payer: 59 | Admitting: Anesthesiology

## 2020-08-21 DIAGNOSIS — S83242A Other tear of medial meniscus, current injury, left knee, initial encounter: Secondary | ICD-10-CM | POA: Insufficient documentation

## 2020-08-21 DIAGNOSIS — Z79899 Other long term (current) drug therapy: Secondary | ICD-10-CM | POA: Insufficient documentation

## 2020-08-21 HISTORY — PX: KNEE ARTHROSCOPY WITH MEDIAL MENISECTOMY: SHX5651

## 2020-08-21 LAB — POCT PREGNANCY, URINE: Preg Test, Ur: NEGATIVE

## 2020-08-21 SURGERY — ARTHROSCOPY, KNEE, WITH MEDIAL MENISCECTOMY
Anesthesia: General | Site: Knee | Laterality: Left

## 2020-08-21 MED ORDER — BUPIVACAINE HCL (PF) 0.25 % IJ SOLN
INTRAMUSCULAR | Status: DC | PRN
  Administered 2020-08-21: 20 mL

## 2020-08-21 MED ORDER — MIDAZOLAM HCL 5 MG/5ML IJ SOLN
INTRAMUSCULAR | Status: DC | PRN
  Administered 2020-08-21: 2 mg via INTRAVENOUS

## 2020-08-21 MED ORDER — FENTANYL CITRATE (PF) 100 MCG/2ML IJ SOLN
INTRAMUSCULAR | Status: AC
  Filled 2020-08-21: qty 2

## 2020-08-21 MED ORDER — ACETAMINOPHEN 500 MG PO TABS: 1000.0000 mg | ORAL_TABLET | Freq: Three times a day (TID) | ORAL | 0 refills | Status: AC

## 2020-08-21 MED ORDER — OXYCODONE HCL 5 MG PO TABS: ORAL_TABLET | ORAL | 0 refills | Status: AC

## 2020-08-21 MED ORDER — FENTANYL CITRATE (PF) 100 MCG/2ML IJ SOLN
25.0000 ug | INTRAMUSCULAR | Status: DC | PRN
Start: 2020-08-21 — End: 2020-08-21
  Administered 2020-08-21 (×3): 50 ug via INTRAVENOUS

## 2020-08-21 MED ORDER — FENTANYL CITRATE (PF) 100 MCG/2ML IJ SOLN
INTRAMUSCULAR | Status: DC | PRN
  Administered 2020-08-21: 50 ug via INTRAVENOUS
  Administered 2020-08-21: 100 ug via INTRAVENOUS
  Administered 2020-08-21: 50 ug via INTRAVENOUS

## 2020-08-21 MED ORDER — OXYCODONE HCL 5 MG/5ML PO SOLN: 5.0000 mg | Freq: Once | ORAL | Status: AC | PRN

## 2020-08-21 MED ORDER — ONDANSETRON HCL 4 MG/2ML IJ SOLN
INTRAMUSCULAR | Status: DC | PRN
  Administered 2020-08-21: 4 mg via INTRAVENOUS

## 2020-08-21 MED ORDER — OXYCODONE HCL 5 MG PO TABS
ORAL_TABLET | ORAL | Status: AC
  Filled 2020-08-21: qty 1

## 2020-08-21 MED ORDER — CEFAZOLIN SODIUM-DEXTROSE 2-4 GM/100ML-% IV SOLN
2.0000 g | INTRAVENOUS | Status: AC
  Administered 2020-08-21: 2 g via INTRAVENOUS

## 2020-08-21 MED ORDER — DEXAMETHASONE SODIUM PHOSPHATE 10 MG/ML IJ SOLN
INTRAMUSCULAR | Status: DC | PRN
  Administered 2020-08-21: 10 mg via INTRAVENOUS

## 2020-08-21 MED ORDER — CEFAZOLIN SODIUM-DEXTROSE 2-4 GM/100ML-% IV SOLN
INTRAVENOUS | Status: AC
  Filled 2020-08-21: qty 100

## 2020-08-21 MED ORDER — AMISULPRIDE (ANTIEMETIC) 5 MG/2ML IV SOLN
5.0000 mg | Freq: Once | INTRAVENOUS | Status: AC
  Administered 2020-08-21: 5 mg via INTRAVENOUS

## 2020-08-21 MED ORDER — ASPIRIN 81 MG PO CHEW: 81.0000 mg | CHEWABLE_TABLET | Freq: Two times a day (BID) | ORAL | 0 refills | Status: AC

## 2020-08-21 MED ORDER — LIDOCAINE 2% (20 MG/ML) 5 ML SYRINGE
INTRAMUSCULAR | Status: AC
  Filled 2020-08-21: qty 5

## 2020-08-21 MED ORDER — ONDANSETRON HCL 4 MG/2ML IJ SOLN
INTRAMUSCULAR | Status: AC
  Filled 2020-08-21: qty 2

## 2020-08-21 MED ORDER — SODIUM CHLORIDE 0.9 % IR SOLN: Status: DC | PRN

## 2020-08-21 MED ORDER — OXYCODONE HCL 5 MG PO TABS
5.0000 mg | ORAL_TABLET | Freq: Once | ORAL | Status: AC | PRN
  Administered 2020-08-21: 5 mg via ORAL

## 2020-08-21 MED ORDER — AMISULPRIDE (ANTIEMETIC) 5 MG/2ML IV SOLN
INTRAVENOUS | Status: AC
  Filled 2020-08-21: qty 2

## 2020-08-21 MED ORDER — DEXMEDETOMIDINE (PRECEDEX) IN NS 20 MCG/5ML (4 MCG/ML) IV SYRINGE
PREFILLED_SYRINGE | INTRAVENOUS | Status: DC | PRN
  Administered 2020-08-21: 12 ug via INTRAVENOUS

## 2020-08-21 MED ORDER — PROPOFOL 10 MG/ML IV BOLUS
INTRAVENOUS | Status: DC | PRN
  Administered 2020-08-21: 200 mg via INTRAVENOUS

## 2020-08-21 MED ORDER — ONDANSETRON HCL 4 MG/2ML IJ SOLN
4.0000 mg | Freq: Once | INTRAMUSCULAR | Status: AC | PRN
  Administered 2020-08-21: 4 mg via INTRAVENOUS

## 2020-08-21 MED ORDER — DEXMEDETOMIDINE (PRECEDEX) IN NS 20 MCG/5ML (4 MCG/ML) IV SYRINGE
PREFILLED_SYRINGE | INTRAVENOUS | Status: AC
  Filled 2020-08-21: qty 5

## 2020-08-21 MED ORDER — DEXAMETHASONE SODIUM PHOSPHATE 10 MG/ML IJ SOLN
INTRAMUSCULAR | Status: AC
  Filled 2020-08-21: qty 1

## 2020-08-21 MED ORDER — PHENYLEPHRINE 40 MCG/ML (10ML) SYRINGE FOR IV PUSH (FOR BLOOD PRESSURE SUPPORT)
PREFILLED_SYRINGE | INTRAVENOUS | Status: AC
  Filled 2020-08-21: qty 10

## 2020-08-21 MED ORDER — LACTATED RINGERS IV SOLN: INTRAVENOUS | Status: DC

## 2020-08-21 MED ORDER — ONDANSETRON HCL 4 MG PO TABS: 4.0000 mg | ORAL_TABLET | Freq: Three times a day (TID) | ORAL | 0 refills | Status: AC | PRN

## 2020-08-21 MED ORDER — LIDOCAINE HCL (CARDIAC) PF 100 MG/5ML IV SOSY
PREFILLED_SYRINGE | INTRAVENOUS | Status: DC | PRN
  Administered 2020-08-21: 60 mg via INTRAVENOUS

## 2020-08-21 MED ORDER — KETOROLAC TROMETHAMINE 30 MG/ML IJ SOLN
INTRAMUSCULAR | Status: AC
  Filled 2020-08-21: qty 1

## 2020-08-21 MED ORDER — MIDAZOLAM HCL 2 MG/2ML IJ SOLN
INTRAMUSCULAR | Status: AC
  Filled 2020-08-21: qty 2

## 2020-08-21 MED ORDER — MELOXICAM 7.5 MG PO TABS: 7.5000 mg | ORAL_TABLET | Freq: Every day | ORAL | 0 refills | Status: AC

## 2020-08-21 SURGICAL SUPPLY — 40 items
APL PRP STRL LF DISP 70% ISPRP (MISCELLANEOUS) ×1
BANDAGE ESMARK 6X9 LF (GAUZE/BANDAGES/DRESSINGS) IMPLANT
BLADE CLIPPER SURG (BLADE) IMPLANT
BNDG CMPR 9X6 STRL LF SNTH (GAUZE/BANDAGES/DRESSINGS)
BNDG ELASTIC 6X5.8 VLCR STR LF (GAUZE/BANDAGES/DRESSINGS) ×2 IMPLANT
BNDG ESMARK 6X9 LF (GAUZE/BANDAGES/DRESSINGS)
CHLORAPREP W/TINT 26 (MISCELLANEOUS) ×2 IMPLANT
CLSR STERI-STRIP ANTIMIC 1/2X4 (GAUZE/BANDAGES/DRESSINGS) ×2 IMPLANT
CUFF TOURN SGL QUICK 34 (TOURNIQUET CUFF) ×2
CUFF TRNQT CYL 34X4.125X (TOURNIQUET CUFF) ×1 IMPLANT
DISSECTOR 3.5MM X 13CM CVD (MISCELLANEOUS) IMPLANT
DISSECTOR 4.0MMX13CM CVD (MISCELLANEOUS) ×2 IMPLANT
DRAPE ARTHROSCOPY W/POUCH 90 (DRAPES) ×2 IMPLANT
DRAPE IMP U-DRAPE 54X76 (DRAPES) ×2 IMPLANT
DRAPE U-SHAPE 47X51 STRL (DRAPES) ×2 IMPLANT
GAUZE SPONGE 4X4 12PLY STRL (GAUZE/BANDAGES/DRESSINGS) ×2 IMPLANT
GLOVE SRG 8 PF TXTR STRL LF DI (GLOVE) ×1 IMPLANT
GLOVE SURG ENC MOIS LTX SZ6.5 (GLOVE) ×2 IMPLANT
GLOVE SURG LTX SZ6.5 (GLOVE) ×2 IMPLANT
GLOVE SURG LTX SZ8 (GLOVE) ×4 IMPLANT
GLOVE SURG UNDER POLY LF SZ6.5 (GLOVE) ×2 IMPLANT
GLOVE SURG UNDER POLY LF SZ7 (GLOVE) ×2 IMPLANT
GLOVE SURG UNDER POLY LF SZ8 (GLOVE) ×2
GOWN STRL REUS W/ TWL LRG LVL3 (GOWN DISPOSABLE) ×1 IMPLANT
GOWN STRL REUS W/TWL LRG LVL3 (GOWN DISPOSABLE) ×2
GOWN STRL REUS W/TWL XL LVL3 (GOWN DISPOSABLE) ×2 IMPLANT
KIT TURNOVER KIT B (KITS) ×2 IMPLANT
MANIFOLD NEPTUNE II (INSTRUMENTS) IMPLANT
NDL SAFETY ECLIPSE 18X1.5 (NEEDLE) ×1 IMPLANT
NEEDLE HYPO 18GX1.5 SHARP (NEEDLE) ×2
NS IRRIG 1000ML POUR BTL (IV SOLUTION) IMPLANT
PACK ARTHROSCOPY DSU (CUSTOM PROCEDURE TRAY) ×2 IMPLANT
PORT APPOLLO RF 90DEGREE MULTI (SURGICAL WAND) IMPLANT
SLEEVE SCD COMPRESS KNEE MED (STOCKING) ×2 IMPLANT
SUT MNCRL AB 4-0 PS2 18 (SUTURE) ×2 IMPLANT
SYR 5ML LUER SLIP (SYRINGE) ×2 IMPLANT
TOWEL GREEN STERILE FF (TOWEL DISPOSABLE) ×2 IMPLANT
TUBE CONNECTING 20X1/4 (TUBING) ×2 IMPLANT
TUBING ARTHROSCOPY IRRIG 16FT (MISCELLANEOUS) ×2 IMPLANT
WATER STERILE IRR 1000ML POUR (IV SOLUTION) ×2 IMPLANT

## 2020-08-21 NOTE — Op Note (Signed)
Orthopaedic Surgery Operative Note (CSN: [id number])  Theresa Wheeler  11-Mar-1991 Date of Surgery: 08/21/2020   Diagnoses:  MEDIAL MENISCUS TEAR LEFT KNEE  Procedure: Left knee partial medial meniscectomy   Operative Finding Exam under anesthesia: Full motion no limitation no sign of instability good endpoint on Lachman Suprapatellar pouch: Normal Patellofemoral Compartment: Normal Medial Compartment: Area of grade 2 changes on the central medial femoral condyle without full-thickness component.  Undersurface but unstable medial meniscus tear that was nearly all the way through.  It was relatively central and was not amenable to repair.  20% total meniscal volume was resected posterior medially Lateral Compartment: Normal Intercondylar Notch: Normal ACL, unable to see ACL cyst as is seen on MRI without damage to the ACL  Successful completion of the planned procedure.  Unfortunately patient's tear was peripheral enough that there would be appropriate tissue for repair.  We thus performed a partial medial meniscectomy.  Post-operative plan: The patient will be discharged home.  The patient will be weightbearing to tolerance.  DVT prophylaxis Aspirin 81 mg twice daily for 6 weeks.  Pain control with PRN pain medication preferring oral medicines.  Follow up plan will be scheduled in approximately 7 days for incision check and XR.  Post-Op Diagnosis: Same Surgeons:Primary: Bjorn Pippin, MD Assistants:Caroline McBane PA-C Location: [facility] OR ROOM 6 Anesthesia: General with local Antibiotics: Ancef 2 g Tourniquet time:  Total Tourniquet Time Documented: Thigh (Left) - 15 minutes Total: Thigh (Left) - 15 minutes  Estimated Blood Loss: Minimal Complications: None Specimens: None Implants: * No implants in log *  Indications for Surgery:   Theresa Wheeler is a 30 y.o. female with MRI demonstrated medial meniscus tear and mechanical symptoms.  Patient failed nonoperative measures.   Benefits and risks of operative and nonoperative management were discussed prior to surgery with patient/guardian(s) and informed consent form was completed.  Specific risks including infection, need for additional surgery, post meniscectomy syndrome, continued pain, further injury amongst others   Procedure:   The patient was identified properly. Informed consent was obtained and the surgical site was marked. The patient was taken up to suite where general anesthesia was induced. The patient was placed in the supine position with a post against the surgical leg and a nonsterile tourniquet applied. The surgical leg was then prepped and draped usual sterile fashion.  A standard surgical timeout was performed.  2 standard anterior portals were made and diagnostic arthroscopy performed. Please note the findings as noted above.  We cleared the joint as above.  We identified the posterior medial meniscus tear which was an undersurface tear near the root.  We performed a partial medial meniscectomy as there was not enough tissue for an appropriate repair that would guarantee healing.  Incisions closed with absorbable suture. The patient was awoken from general anesthesia and taken to the PACU in stable condition without complication.   Alfonse Alpers, PA-C, present and scrubbed throughout the case, critical for completion in a timely fashion, and for retraction, instrumentation, closure.

## 2020-08-21 NOTE — Transfer of Care (Signed)
Immediate Anesthesia Transfer of Care Note  Patient: Theresa Wheeler  Procedure(s) Performed: LEFT KNEE ARTHROSCOPY WITH MEDIAL MENISECTOMY (Left Knee)  Patient Location: PACU  Anesthesia Type:General  Level of Consciousness: drowsy  Airway & Oxygen Therapy: Patient Spontanous Breathing and Patient connected to face mask oxygen  Post-op Assessment: Report given to RN and Post -op Vital signs reviewed and stable  Post vital signs: Reviewed and stable  Last Vitals:  Vitals Value Taken Time  BP 117/89 08/21/20 1017  Temp    Pulse 57 08/21/20 1018  Resp 13 08/21/20 1018  SpO2 100 % 08/21/20 1018  Vitals shown include unvalidated device data.  Last Pain:  Vitals:   08/21/20 0817  TempSrc: Oral  PainSc: 3       Patients Stated Pain Goal: 4 (08/21/20 0817)  Complications: No complications documented.

## 2020-08-21 NOTE — Discharge Instructions (Signed)
Ramond Marrow MD, MPH Alfonse Alpers, PA-C [facility] [address] [phone] (tel)   [phone] (fax)   POST-OPERATIVE INSTRUCTIONS - Knee Arthroscopy  WOUND CARE - You may remove the Operative Dressing on Post-Op Day #3 (72hrs after surgery).   -  Alternatively if you would like you can leave dressing on until follow-up if within 7-8 days but keep it dry. - Leave steri-strips in place until they fall off on their own, usually 2 weeks postop. - An ACE wrap may be used to control swelling, do not wrap this too tight.  If the initial ACE wrap feels too tight you may loosen it. - There may be a small amount of fluid/bleeding leaking at the surgical site.  - This is normal; the knee is filled with fluid during the procedure and can leak for 24-48hrs after surgery. You may change/reinforce the bandage as needed.  - Use the Cryocuff or Ice as often as possible for the first 7 days, then as needed for pain relief. Always keep a towel, ACE wrap or other barrier between the cooling unit and your skin.  - You may shower on Post-Op Day #3. Gently pat the area dry. Do not soak the knee in water or submerge it.  - Do not go swimming in the pool or ocean until 4 weeks after surgery or when otherwise instructed.  Keep dry incisions as dry as possible.   BRACE/AMBULATION -            You will not need a brace after this procedure.   - You can put full weight on your operative leg as you feel comfortable - You may use crutches initially to help you ambulate but this is not required  REGIONAL ANESTHESIA (NERVE BLOCKS) - The anesthesia team may have performed a nerve block for you if safe in the setting of your care.  This is a great tool used to minimize pain.  Typically the block may start wearing off overnight.  This can be a challenging period but please utilize your as needed pain medications to try and manage this period and know it will be a brief transition as  the nerve block wears completely   POST-OP MEDICATIONS - Multimodal approach to pain control - In general your pain will be controlled with a combination of substances.  Prescriptions unless otherwise discussed are electronically sent to your pharmacy.  This is a carefully made plan we use to minimize narcotic use.     - Meloxicam  - Anti-inflammatory medication taken on a scheduled basis   - Take 1 tablet once a day - Acetaminophen - Non-narcotic pain medicine taken on a scheduled basis    - Take two 500 mg tablets (1,000 mg total) every 8 hours - Oxycodone - This is a strong narcotic, to be used only on an "as needed" basis for pain. - Aspirin 81mg  - This medicine is used to minimize the risk of blood clots after surgery.   - Take 1 tablet twice a day for 6 weeks -  Zofran - take as needed for nausea   FOLLOW-UP   Please call the office to schedule a follow-up appointment for your incision check, 7-10 days post-operatively.   IF YOU HAVE ANY QUESTIONS, PLEASE FEEL FREE TO CALL OUR OFFICE.   HELPFUL INFORMATION  - If you had a block, it will wear off between 8-24 hrs postop typically.  This is period when your pain may go  from nearly zero to the pain you would have had post-op without the block.  This is an abrupt transition but nothing dangerous is happening.  You may take an extra dose of narcotic when this happens.   Keep your leg elevated to decrease swelling, which will then in turn decrease your pain. I would elevate the foot of your bed by putting a couple of couch pillows between your mattress and box spring. I would not keep pillow directly under your ankle.  - Do not sleep with a pillow behind your knee even if it is more comfortable as this may make it harder to get your knee fully straight long term.   There will be MORE swelling on days 1-3 than there is on the day of surgery.  This also is normal. The swelling will decrease with the anti-inflammatory medication, ice  and keeping it elevated. The swelling will make it more difficult to bend your knee. As the swelling goes down your motion will become easier   You may develop swelling and bruising that extends from your knee down to your calf and perhaps even to your foot over the next week. Do not be alarmed. This too is normal, and it is due to gravity   There may be some numbness adjacent to the incision site. This may last for 6-12 months or longer in some patients and is expected.   You may return to sedentary work/school in the next couple of days when you feel up to it. You will need to keep your leg elevated as much as possible    You should wean off your narcotic medicines as soon as you are able.  Most patients will be off or using minimal narcotics before their first postop appointment.    We suggest you use the pain medication the first night prior to going to bed, in order to ease any pain when the anesthesia wears off. You should avoid taking pain medications on an empty stomach as it will make you nauseous.   Do not drink alcoholic beverages or take illicit drugs when taking pain medications.   It is against the law to drive while taking narcotics. You cannot drive if your Right leg is in brace locked in extension.   Pain medication may make you constipated.  Below are a few solutions to try in this order:  o Decrease the amount of pain medication if you aren't having pain.  o Drink lots of decaffeinated fluids.  o Drink prune juice and/or eat dried prunes   o If the first 3 don't work start with additional solutions  o Take Colace - an over-the-counter stool softener  o Take Senokot - an over-the-counter laxative  o Take Miralax - a stronger over-the-counter laxative    For more information including helpful videos and documents visit our website:   [email or web address]    Post Anesthesia Home Care Instructions  Activity: Get plenty of  rest for the remainder of the day. A responsible individual must stay with you for 24 hours following the procedure.  For the next 24 hours, DO NOT: -Drive a car -Advertising copywriter -Drink alcoholic beverages -Take any medication unless instructed by your physician -Make any legal decisions or sign important papers.  Meals: Start with liquid foods such as gelatin or soup. Progress to regular foods as tolerated. Avoid greasy, spicy, heavy foods. If nausea and/or vomiting occur, drink only clear liquids until the nausea and/or vomiting subsides. Call your physician if vomiting  continues.  Special Instructions/Symptoms: Your throat may feel dry or sore from the anesthesia or the breathing tube placed in your throat during surgery. If this causes discomfort, gargle with warm salt water. The discomfort should disappear within 24 hours.  If you had a scopolamine patch placed behind your ear for the management of post- operative nausea and/or vomiting:  1. The medication in the patch is effective for 72 hours, after which it should be removed.  Wrap patch in a tissue and discard in the trash. Wash hands thoroughly with soap and water. 2. You may remove the patch earlier than 72 hours if you experience unpleasant side effects which may include dry mouth, dizziness or visual disturbances. 3. Avoid touching the patch. Wash your hands with soap and water after contact with the patch.

## 2020-08-21 NOTE — Anesthesia Preprocedure Evaluation (Signed)

## 2020-08-21 NOTE — Interval H&P Note (Signed)
History and Physical Interval Note:  08/21/2020 8:38 AM  Theresa Wheeler  has presented today for surgery, with the diagnosis of MEDIAL MENISCUS TEAR LEFT KNEE.  The various methods of treatment have been discussed with the patient and family. After consideration of risks, benefits and other options for treatment, the patient has consented to  Procedure(s): LEFT KNEE ARTHROSCOPY WITH MEDIAL MENISECTOMY (Left) as a surgical intervention.  The patient's history has been reviewed, patient examined, no change in status, stable for surgery.  I have reviewed the patient's chart and labs.  Questions were answered to the patient's satisfaction.     Bjorn Pippin

## 2020-08-21 NOTE — Anesthesia Postprocedure Evaluation (Signed)
Anesthesia Post Note  Patient: Theresa Wheeler  Procedure(s) Performed: LEFT KNEE ARTHROSCOPY WITH MEDIAL MENISECTOMY (Left Knee)     Patient location during evaluation: PACU Anesthesia Type: General Level of consciousness: awake and alert Pain management: pain level controlled Vital Signs Assessment: post-procedure vital signs reviewed and stable Respiratory status: spontaneous breathing, nonlabored ventilation, respiratory function stable and patient connected to nasal cannula oxygen Cardiovascular status: blood pressure returned to baseline and stable Postop Assessment: no apparent nausea or vomiting Anesthetic complications: no   No complications documented.  Last Vitals:  Vitals:   08/21/20 1140 08/21/20 1216  BP: 113/65 116/68  Pulse: 77 65  Resp: 18 18  Temp:  36.7 C  SpO2: 100% 100%    Last Pain:  Vitals:   08/21/20 1113  TempSrc:   PainSc: Asleep                 Cia Garretson COKER

## 2020-08-21 NOTE — Anesthesia Procedure Notes (Signed)
Procedure Name: LMA Insertion Date/Time: 08/21/2020 9:31 AM Performed by: Caren Macadam, CRNA Pre-anesthesia Checklist: Patient identified, Emergency Drugs available, Suction available and Patient being monitored Patient Re-evaluated:Patient Re-evaluated prior to induction Oxygen Delivery Method: Circle system utilized Preoxygenation: Pre-oxygenation with 100% oxygen Induction Type: IV induction Ventilation: Mask ventilation without difficulty LMA: LMA inserted LMA Size: 4.0 Number of attempts: 1 Placement Confirmation: positive ETCO2 and breath sounds checked- equal and bilateral Tube secured with: Tape Dental Injury: Teeth and Oropharynx as per pre-operative assessment

## 2020-08-22 ENCOUNTER — Encounter (HOSPITAL_BASED_OUTPATIENT_CLINIC_OR_DEPARTMENT_OTHER): Payer: Self-pay | Admitting: Orthopaedic Surgery

## 2020-09-03 ENCOUNTER — Encounter: Payer: Self-pay | Admitting: Osteopathic Medicine

## 2020-09-05 MED ORDER — PHENTERMINE-TOPIRAMATE ER 7.5-46 MG PO CP24: 1.0000 | ORAL_CAPSULE | Freq: Every morning | ORAL | 0 refills | Status: DC

## 2020-09-05 NOTE — Telephone Encounter (Deleted)
Please call patient: schedule virtual visit to discus alternatives for weight loss medication if desired - MyChart

## 2020-09-23 ENCOUNTER — Ambulatory Visit: Payer: 59 | Admitting: Sports Medicine

## 2020-09-25 ENCOUNTER — Other Ambulatory Visit: Payer: Self-pay

## 2020-09-25 DIAGNOSIS — F419 Anxiety disorder, unspecified: Secondary | ICD-10-CM

## 2020-09-25 NOTE — Telephone Encounter (Signed)
Pt called requesting med refill for alprazolam. Pls send rx to pharmacy on file. Rx pended.

## 2020-09-26 ENCOUNTER — Telehealth (INDEPENDENT_AMBULATORY_CARE_PROVIDER_SITE_OTHER): Payer: 59 | Admitting: Osteopathic Medicine

## 2020-09-26 DIAGNOSIS — Z91199 Patient's noncompliance with other medical treatment and regimen due to unspecified reason: Secondary | ICD-10-CM

## 2020-09-26 DIAGNOSIS — Z5329 Procedure and treatment not carried out because of patient's decision for other reasons: Secondary | ICD-10-CM

## 2020-09-26 MED ORDER — ALPRAZOLAM 0.5 MG PO TABS: 0.2500 mg | ORAL_TABLET | Freq: Two times a day (BID) | ORAL | 0 refills | Status: DC | PRN

## 2020-09-26 NOTE — Progress Notes (Signed)
Attempted to contact patient at 755 am and 815 am, no answer. Left multiple detailed vm msgs.

## 2020-10-04 ENCOUNTER — Other Ambulatory Visit: Payer: Self-pay

## 2020-10-04 DIAGNOSIS — Z8619 Personal history of other infectious and parasitic diseases: Secondary | ICD-10-CM

## 2020-10-04 MED ORDER — VALACYCLOVIR HCL 1 G PO TABS: 1000.0000 mg | ORAL_TABLET | Freq: Every day | ORAL | 0 refills | Status: DC

## 2020-11-11 ENCOUNTER — Encounter: Payer: Self-pay | Admitting: Osteopathic Medicine

## 2020-11-12 ENCOUNTER — Other Ambulatory Visit: Payer: Self-pay | Admitting: Osteopathic Medicine

## 2020-11-12 ENCOUNTER — Telehealth (INDEPENDENT_AMBULATORY_CARE_PROVIDER_SITE_OTHER): Payer: 59 | Admitting: Osteopathic Medicine

## 2020-11-12 ENCOUNTER — Encounter: Payer: Self-pay | Admitting: Osteopathic Medicine

## 2020-11-12 VITALS — BP 122/73 | HR 85 | Temp 98.3°F | Wt 178.0 lb

## 2020-11-12 DIAGNOSIS — Z7689 Persons encountering health services in other specified circumstances: Secondary | ICD-10-CM | POA: Diagnosis not present

## 2020-11-12 MED ORDER — WEGOVY 1.7 MG/0.75ML ~~LOC~~ SOAJ: 1.7000 mg | SUBCUTANEOUS | 1 refills | Status: DC

## 2020-11-12 MED ORDER — ONDANSETRON 8 MG PO TBDP: 8.0000 mg | ORAL_TABLET | Freq: Three times a day (TID) | ORAL | 2 refills | Status: DC | PRN
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
## 2020-12-10 ENCOUNTER — Other Ambulatory Visit: Payer: Self-pay | Admitting: Osteopathic Medicine

## 2020-12-10 DIAGNOSIS — F419 Anxiety disorder, unspecified: Secondary | ICD-10-CM

## 2020-12-11 DIAGNOSIS — S83222D Peripheral tear of medial meniscus, current injury, left knee, subsequent encounter: Secondary | ICD-10-CM

## 2020-12-11 NOTE — Telephone Encounter (Signed)
Last written 09/26/2020 #30 no refills Last appt 11/12/2020

## 2020-12-12 ENCOUNTER — Telehealth: Payer: Self-pay | Admitting: Sports Medicine

## 2020-12-12 MED ORDER — OXYCODONE-ACETAMINOPHEN 5-325 MG PO TABS: 1.0000 | ORAL_TABLET | Freq: Three times a day (TID) | ORAL | 0 refills | Status: DC | PRN

## 2020-12-12 NOTE — Telephone Encounter (Signed)
Please work on [employer], left knee only, arthroscopy confirmed OA.

## 2020-12-20 ENCOUNTER — Ambulatory Visit: Payer: 59 | Admitting: Sports Medicine

## 2020-12-23 ENCOUNTER — Ambulatory Visit: Payer: Self-pay

## 2020-12-23 ENCOUNTER — Other Ambulatory Visit: Payer: Self-pay

## 2020-12-23 ENCOUNTER — Ambulatory Visit: Payer: 59 | Admitting: Sports Medicine

## 2020-12-23 DIAGNOSIS — S83222D Peripheral tear of medial meniscus, current injury, left knee, subsequent encounter: Secondary | ICD-10-CM

## 2020-12-23 MED ORDER — OXYCODONE-ACETAMINOPHEN 5-325 MG PO TABS: 1.0000 | ORAL_TABLET | Freq: Three times a day (TID) | ORAL | 0 refills | Status: DC | PRN

## 2020-12-23 NOTE — Assessment & Plan Note (Addendum)
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 Everardo Pacific, she did have some areas of cartilage loss in her meniscus was addressed. Unfortunately she started having increasing pain, anterior and medial joint line. On exam she has a positive McMurray's sign with pain but no mechanical symptoms. Refilling Percocet, number 30/month, I reinjected her knee today and due to persistent pain postoperatively I really think we need an updated MRI. We will also get her back in with Dr. Everardo Pacific. Do think she also needs some additional physical therapy which we will order based on the results of the MRI.
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
## 2020-12-29 ENCOUNTER — Other Ambulatory Visit: Payer: 59

## 2020-12-30 ENCOUNTER — Ambulatory Visit (INDEPENDENT_AMBULATORY_CARE_PROVIDER_SITE_OTHER): Payer: 59

## 2020-12-30 ENCOUNTER — Other Ambulatory Visit: Payer: Self-pay

## 2020-12-30 DIAGNOSIS — S83222D Peripheral tear of medial meniscus, current injury, left knee, subsequent encounter: Secondary | ICD-10-CM

## 2020-12-31 ENCOUNTER — Encounter: Payer: Self-pay | Admitting: Obstetrics and Gynecology

## 2020-12-31 ENCOUNTER — Ambulatory Visit: Payer: 59 | Admitting: Obstetrics and Gynecology

## 2020-12-31 ENCOUNTER — Ambulatory Visit (INDEPENDENT_AMBULATORY_CARE_PROVIDER_SITE_OTHER): Payer: 59

## 2020-12-31 VITALS — BP 118/72 | HR 60 | Ht 63.0 in | Wt 181.0 lb

## 2020-12-31 DIAGNOSIS — R102 Pelvic and perineal pain: Secondary | ICD-10-CM | POA: Diagnosis not present

## 2020-12-31 DIAGNOSIS — N939 Abnormal uterine and vaginal bleeding, unspecified: Secondary | ICD-10-CM
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
## 2021-01-01 LAB — SURESWAB CT/NG/T. VAGINALIS
C. trachomatis RNA, TMA: NOT DETECTED
N. gonorrhoeae RNA, TMA: NOT DETECTED
Trichomonas vaginalis RNA: NOT DETECTED

## 2021-01-01 LAB — PREGNANCY, URINE: Preg Test, Ur: NEGATIVE

## 2021-01-01 NOTE — Telephone Encounter (Signed)
Cindy, we have not had the ability to get together for Orthovisc PA stuff. Can you help with this until we get together? 

## 2021-01-02 NOTE — Addendum Note (Signed)
Addended by: Monica Becton on: 01/02/2021 08:41 AM   Modules accepted: Orders

## 2021-01-03 ENCOUNTER — Telehealth: Payer: Self-pay | Admitting: Obstetrics and Gynecology

## 2021-01-03 NOTE — Telephone Encounter (Signed)
Mychart message sent with ultrasound results.

## 2021-01-08 ENCOUNTER — Other Ambulatory Visit: Payer: Self-pay | Admitting: Osteopathic Medicine

## 2021-01-08 ENCOUNTER — Encounter: Payer: Self-pay | Admitting: Physical Therapy

## 2021-01-08 ENCOUNTER — Other Ambulatory Visit: Payer: Self-pay

## 2021-01-08 ENCOUNTER — Ambulatory Visit (INDEPENDENT_AMBULATORY_CARE_PROVIDER_SITE_OTHER): Payer: 59 | Admitting: Physical Therapy

## 2021-01-08 DIAGNOSIS — R29898 Other symptoms and signs involving the musculoskeletal system: Secondary | ICD-10-CM

## 2021-01-08 DIAGNOSIS — M25562 Pain in left knee: Secondary | ICD-10-CM

## 2021-01-08 DIAGNOSIS — M6281 Muscle weakness (generalized): Secondary | ICD-10-CM

## 2021-01-08 DIAGNOSIS — Z8619 Personal history of other infectious and parasitic diseases: Secondary | ICD-10-CM

## 2021-01-08 DIAGNOSIS — G8929 Other chronic pain: Secondary | ICD-10-CM | POA: Diagnosis not present

## 2021-01-08 NOTE — Patient Instructions (Signed)
Access Code: [email or web address] URL: [email or web address] Date: 01/08/2021 Prepared by: Reggy Eye  Exercises Standing Repeated Hip Flexion with Ankle Weight - 1 x daily - 7 x weekly - 3 sets - 10 reps Step Up - 1 x daily - 7 x weekly - 3 sets - 10 reps Lateral Step Up - 1 x daily - 7 x weekly - 3 sets - 10 reps Wall Squat - 1 x daily - 7 x weekly - 2 sets - 10 reps - 3 seconds hold
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
## 2021-01-17 ENCOUNTER — Encounter: Payer: 59 | Admitting: Physical Therapy

## 2021-01-23 ENCOUNTER — Other Ambulatory Visit: Payer: Self-pay

## 2021-01-23 ENCOUNTER — Ambulatory Visit (INDEPENDENT_AMBULATORY_CARE_PROVIDER_SITE_OTHER): Payer: 59 | Admitting: Physical Therapy

## 2021-01-23 DIAGNOSIS — G8929 Other chronic pain: Secondary | ICD-10-CM

## 2021-01-23 DIAGNOSIS — M6281 Muscle weakness (generalized): Secondary | ICD-10-CM

## 2021-01-23 DIAGNOSIS — R29898 Other symptoms and signs involving the musculoskeletal system: Secondary | ICD-10-CM | POA: Diagnosis not present

## 2021-01-23 DIAGNOSIS — M25562 Pain in left knee: Secondary | ICD-10-CM

## 2021-01-23 NOTE — Patient Instructions (Signed)
Kinesiology tape What is kinesiology tape?  There are many brands of kinesiology tape.  KTape, [employer], [employer], Dynamic tape, to name a few. It is an elasticized tape designed to support the body's natural healing process. This tape provides stability and support to muscles and joints without restricting motion. It can also help decrease swelling in the area of application. How does it work? The tape microscopically lifts and decompresses the skin to allow for drainage of lymph (swelling) to flow away from area, reducing inflammation.  The tape has the ability to help re-educate the neuromuscular system by targeting specific receptors in the skin.  The presence of the tape increases the body's awareness of posture and body mechanics.  Do not use with: Open wounds Skin lesions Adhesive allergies Safe removal of the tape: In some rare cases, mild/moderate skin irritation can occur.  This can include redness, itchiness, or hives. If this occurs, immediately remove tape and consult your primary care physician if symptoms are severe or do not resolve within 2 days.  To remove tape safely, hold nearby skin with one hand and gentle roll tape down with other hand.  You can apply oil or conditioner to tape while in shower prior to removal to loosen adhesive.  DO NOT swiftly rip tape off like a band-aid, as this could cause skin tears and additional skin irritation.    Trigger Point Dry Needling  What is Trigger Point Dry Needling (DN)? DN is a physical therapy technique used to treat muscle pain and dysfunction. Specifically, DN helps deactivate muscle trigger points (muscle knots).  A thin filiform needle is used to penetrate the skin and stimulate the underlying trigger point. The goal is for a local twitch response (LTR) to occur and for the trigger point to relax. No medication of any kind is injected during the procedure.   What Does Trigger Point Dry Needling Feel Like?  The procedure feels  different for each individual patient. Some patients report that they do not actually feel the needle enter the skin and overall the process is not painful. Very mild bleeding may occur. However, many patients feel a deep cramping in the muscle in which the needle was inserted. This is the local twitch response.   How Will I feel after the treatment? Soreness is normal, and the onset of soreness may not occur for a few hours. Typically this soreness does not last longer than two days.  Bruising is uncommon, however; ice can be used to decrease any possible bruising.  In rare cases feeling tired or nauseous after the treatment is normal. In addition, your symptoms may get worse before they get better, this period will typically not last longer than 24 hours.   What Can I do After My Treatment? Increase your hydration by drinking more water for the next 24 hours. You may place ice or heat on the areas treated that have become sore, however, do not use heat on inflamed or bruised areas. Heat often brings more relief post needling. You can continue your regular activities, but vigorous activity is not recommended initially after the treatment for 24 hours. DN is best combined with other physical therapy such as strengthening, stretching, and other therapies.
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
## 2021-01-28 ENCOUNTER — Other Ambulatory Visit: Payer: Self-pay | Admitting: Family Medicine

## 2021-01-28 DIAGNOSIS — F419 Anxiety disorder, unspecified: Secondary | ICD-10-CM

## 2021-01-29 ENCOUNTER — Encounter: Payer: Self-pay | Admitting: Physical Therapy

## 2021-01-29 ENCOUNTER — Ambulatory Visit (INDEPENDENT_AMBULATORY_CARE_PROVIDER_SITE_OTHER): Payer: 59 | Admitting: Physical Therapy

## 2021-01-29 ENCOUNTER — Other Ambulatory Visit: Payer: Self-pay

## 2021-01-29 DIAGNOSIS — G8929 Other chronic pain: Secondary | ICD-10-CM

## 2021-01-29 DIAGNOSIS — M6281 Muscle weakness (generalized): Secondary | ICD-10-CM

## 2021-01-29 DIAGNOSIS — M25562 Pain in left knee: Secondary | ICD-10-CM

## 2021-01-29 DIAGNOSIS — R29898 Other symptoms and signs involving the musculoskeletal system: Secondary | ICD-10-CM
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
## 2021-01-30 ENCOUNTER — Other Ambulatory Visit: Payer: Self-pay | Admitting: Osteopathic Medicine

## 2021-01-30 ENCOUNTER — Encounter: Payer: Self-pay | Admitting: Osteopathic Medicine

## 2021-01-30 DIAGNOSIS — F419 Anxiety disorder, unspecified: Secondary | ICD-10-CM

## 2021-02-05 ENCOUNTER — Encounter: Payer: Self-pay | Admitting: Physical Therapy

## 2021-02-05 ENCOUNTER — Other Ambulatory Visit: Payer: Self-pay

## 2021-02-05 ENCOUNTER — Ambulatory Visit (INDEPENDENT_AMBULATORY_CARE_PROVIDER_SITE_OTHER): Payer: 59 | Admitting: Physical Therapy

## 2021-02-05 DIAGNOSIS — M25562 Pain in left knee: Secondary | ICD-10-CM

## 2021-02-05 DIAGNOSIS — R29898 Other symptoms and signs involving the musculoskeletal system: Secondary | ICD-10-CM

## 2021-02-05 DIAGNOSIS — G8929 Other chronic pain: Secondary | ICD-10-CM

## 2021-02-05 DIAGNOSIS — M6281 Muscle weakness (generalized): Secondary | ICD-10-CM
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
## 2021-02-12 ENCOUNTER — Ambulatory Visit (INDEPENDENT_AMBULATORY_CARE_PROVIDER_SITE_OTHER): Payer: 59 | Admitting: Physical Therapy

## 2021-02-12 ENCOUNTER — Other Ambulatory Visit: Payer: Self-pay

## 2021-02-12 DIAGNOSIS — M6281 Muscle weakness (generalized): Secondary | ICD-10-CM

## 2021-02-12 DIAGNOSIS — R29898 Other symptoms and signs involving the musculoskeletal system: Secondary | ICD-10-CM

## 2021-02-12 DIAGNOSIS — G8929 Other chronic pain: Secondary | ICD-10-CM

## 2021-02-12 DIAGNOSIS — M25562 Pain in left knee: Secondary | ICD-10-CM
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
## 2021-02-17 ENCOUNTER — Other Ambulatory Visit: Payer: Self-pay | Admitting: Osteopathic Medicine

## 2021-02-24 ENCOUNTER — Telehealth: Payer: Self-pay

## 2021-02-24 NOTE — Telephone Encounter (Signed)
Patient called stating she is waiting for a prior authorization for Wegovy to be submitted to insurance. Please submit to insurance and contact patient with any updates. Thanks in advance.

## 2021-02-26 ENCOUNTER — Telehealth: Payer: Self-pay

## 2021-02-26 NOTE — Telephone Encounter (Signed)
Medication: Semaglutide-Weight Management (WEGOVY) 1.7 MG/0.75ML SOAJ Prior authorization submitted via CoverMyMeds on 02/26/2021 PA submission pending Pt aware of status via MyChart message

## 2021-02-26 NOTE — Telephone Encounter (Signed)
See pt message dated 02/25/2021

## 2021-02-28 NOTE — Telephone Encounter (Signed)
Medication: Semaglutide-Weight Management (WEGOVY) 1.7 MG/0.75ML SOAJ Prior authorization submitted via CoverMyMeds on 02/26/2021 PA submission still pending

## 2021-03-05 ENCOUNTER — Other Ambulatory Visit: Payer: Self-pay | Admitting: Osteopathic Medicine

## 2021-03-05 DIAGNOSIS — Z8619 Personal history of other infectious and parasitic diseases: Secondary | ICD-10-CM

## 2021-03-05 MED ORDER — VALACYCLOVIR HCL 1 G PO TABS: 1000.0000 mg | ORAL_TABLET | Freq: Every day | ORAL | 0 refills | Status: DC

## 2021-03-05 NOTE — Telephone Encounter (Signed)
Medication:  Semaglutide-Weight Management (WEGOVY) 1.7 MG/0.75ML SOAJ Prior authorization submitted via CoverMyMeds on 02/26/2021 PA is still pending I called insurance company and answered the same PA questions over the phone that was submitted via CMM. Was told by Arley Phenix that the answers needed to be sent to the pharmacist for review and I should have a response within 24-48 hours. Pt aware up updated status of PA.

## 2021-03-06 NOTE — Telephone Encounter (Signed)
Medication: Semaglutide-Weight Management (WEGOVY) 1.7 MG/0.75ML SOAJ Prior authorization determination received Medication has been approved Approval dates: 03/05/2021-10/03/2021  Patient aware via: phone Pharmacy aware: Yes Provider aware via this encounter

## 2021-04-16 ENCOUNTER — Telehealth: Payer: Self-pay | Admitting: General Practice

## 2021-04-16 NOTE — Telephone Encounter (Signed)
Transition Care Management Unsuccessful Follow-up Telephone Call  Date of discharge and from where:  04/14/21 from [facility]  Attempts:  1st Attempt  Reason for unsuccessful TCM follow-up call:  Left voice message

## 2021-04-21 NOTE — Telephone Encounter (Signed)
Transition Care Management Unsuccessful Follow-up Telephone Call  Date of discharge and from where:  04/14/21 from [facility]  Attempts:  2nd Attempt  Reason for unsuccessful TCM follow-up call:  Left voice message

## 2021-04-22 NOTE — Telephone Encounter (Signed)
Transition Care Management Unsuccessful Follow-up Telephone Call  Date of discharge and from where:  04/14/2021 from [facility]  Attempts:  3rd Attempt  Reason for unsuccessful TCM follow-up call:  Unable to reach patient

## 2021-04-27 ENCOUNTER — Other Ambulatory Visit: Payer: Self-pay | Admitting: Osteopathic Medicine

## 2021-04-27 DIAGNOSIS — F419 Anxiety disorder, unspecified: Secondary | ICD-10-CM

## 2021-04-28 ENCOUNTER — Other Ambulatory Visit: Payer: Self-pay | Admitting: *Deleted

## 2021-04-28 MED ORDER — ONDANSETRON 8 MG PO TBDP: 8.0000 mg | ORAL_TABLET | Freq: Three times a day (TID) | ORAL | 1 refills | Status: DC | PRN

## 2021-04-28 NOTE — Telephone Encounter (Signed)
Routing to covering provider.  °

## 2021-05-03 ENCOUNTER — Other Ambulatory Visit: Payer: Self-pay | Admitting: Family Medicine

## 2021-05-03 DIAGNOSIS — Z8619 Personal history of other infectious and parasitic diseases: Secondary | ICD-10-CM

## 2021-05-15 ENCOUNTER — Telehealth (INDEPENDENT_AMBULATORY_CARE_PROVIDER_SITE_OTHER): Payer: 59 | Admitting: Family Medicine

## 2021-05-15 ENCOUNTER — Encounter: Payer: Self-pay | Admitting: Family Medicine

## 2021-05-15 VITALS — Ht 63.0 in | Wt 159.6 lb

## 2021-05-15 DIAGNOSIS — Z713 Dietary counseling and surveillance: Secondary | ICD-10-CM | POA: Diagnosis not present

## 2021-05-15 MED ORDER — WEGOVY 2.4 MG/0.75ML ~~LOC~~ SOAJ: 2.4000 mg | SUBCUTANEOUS | 3 refills | Status: DC

## 2021-05-15 NOTE — Progress Notes (Signed)
Virtual Visit via Telephone Note  I connected with  Theresa Wheeler on 05/15/21 at 10:10 AM EST by telephone and verified that I am speaking with the correct person using two identifiers.   I discussed the limitations, risks, security and privacy concerns of performing an evaluation and management service by telephone and the availability of in person appointments. I also discussed with the patient that there may be a patient responsible charge related to this service. The patient expressed understanding and agreed to proceed.  Participating parties included in this telephone visit include: The patient and the nurse practitioner listed.  The patient is: At home I am: In the office  Subjective:    CC: weight management  HPI: Theresa Wheeler is a 30 y.o. year old female presenting today via telephone visit to discuss weight management.  Patient has had very good results on Wegovy so far. States she has been doing good on the 1.7 mg dose and is ready to go up to 2.4 mg. States she will get occasional nausea as a side effect, but she has noticed few side effects if she injects into thigh. Reports she is doing fairly well at eating healthy/balanced meals and staying active. She rides horses 4x/week. Reports her goal weight is in the 140s. She denies any chest pain, dyspnea, vision changes, headaches, syncope.   Home vitals today: 119/74, HR 70, 99% O2     Past medical history, Surgical history, Family history not pertinant except as noted below, Social history, Allergies, and medications have been entered into the medical record, reviewed, and corrections made.   Review of Systems:  All review of systems negative except what is listed in the HPI  Objective:    General:  Patient speaking clearly in complete sentences. No shortness of breath noted.   Alert and oriented x3.   Normal judgment.  No apparent acute distress.  Impression and Recommendations:    1. Weight loss  counseling, encounter for - WEGOVY 2.4 MG/0.75ML SOAJ; Inject 2.4 mg into the skin once a week.  Dispense: 3 mL; Refill: 3  Patient doing really well overall. She is ready to increase to 2.4 mg. Will place order and see if it will process without another prior auth - sending to our team to look into. Patient aware of signs/symptoms requiring further/urgent evaluation.   Follow-up in 3 months to establish care.     I discussed the assessment and treatment plan with the patient. The patient was provided an opportunity to ask questions and all were answered. The patient agreed with the plan and demonstrated an understanding of the instructions.   The patient was advised to call back or seek an in-person evaluation if the symptoms worsen or if the condition fails to improve as anticipated.  I provided 20 minutes of non-face-to-face time during this TELEPHONE encounter.    Clayborne Dana, NP

## 2021-06-12 ENCOUNTER — Other Ambulatory Visit: Payer: Self-pay | Admitting: Medical-Surgical

## 2021-06-12 DIAGNOSIS — F419 Anxiety disorder, unspecified: Secondary | ICD-10-CM

## 2021-06-13 NOTE — Telephone Encounter (Signed)
Must keep establish care appointment for further refills.

## 2021-06-13 NOTE — Telephone Encounter (Signed)
Last refill 04/28/21 Pt has establish care appointment on 06/19/2021

## 2021-06-19 ENCOUNTER — Encounter: Payer: Self-pay | Admitting: Medical-Surgical

## 2021-06-19 ENCOUNTER — Other Ambulatory Visit: Payer: Self-pay

## 2021-06-19 ENCOUNTER — Ambulatory Visit: Payer: 59 | Admitting: Medical-Surgical

## 2021-06-19 VITALS — BP 120/84 | HR 81 | Resp 20 | Ht 63.0 in | Wt 156.0 lb

## 2021-06-19 DIAGNOSIS — Z7689 Persons encountering health services in other specified circumstances: Secondary | ICD-10-CM | POA: Diagnosis not present

## 2021-06-19 DIAGNOSIS — Z8619 Personal history of other infectious and parasitic diseases: Secondary | ICD-10-CM | POA: Diagnosis not present

## 2021-06-19 DIAGNOSIS — Z713 Dietary counseling and surveillance: Secondary | ICD-10-CM

## 2021-06-19 DIAGNOSIS — F419 Anxiety disorder, unspecified: Secondary | ICD-10-CM

## 2021-06-19 NOTE — Progress Notes (Signed)
HPI with pertinent ROS:   CC: Transfer care  HPI: Pleasant 31 year old female presenting today to transfer care to a new PCP and follow-up on  Anxiety-not on a controller medication and not interested in getting one started.  She does use Xanax 0.5 mg 1-2 doses per week for severe anxiety.  Feels her symptoms are 80% well managed but does still have breakthrough periods that are difficult.  She does multiple self calming activities prior to deciding to take Xanax and will only take her dose if these things are ineffective.  She has been using Xanax this way for about 3 years.  Tolerates the medication well without side effects.  Weight loss-has done exceedingly well on Wegovy and is currently using 2.4 mg weekly.  Has Zofran at home for as needed nausea.  No side effects or concerns today.  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  Several years ago had an issue with frequent cold sores so decided to change from as needed dosing to prophylactic.  No side effects or concerns with the medication.  I reviewed the past medical history, family history, social history, surgical history, and allergies today and no changes were needed.  Please see the problem list section below in epic for further details.   Physical exam:   Physical Exam Constitutional:      General: She is not in acute distress.    Appearance: Normal appearance. She is not ill-appearing, toxic-appearing or diaphoretic.  HENT:     Head: Normocephalic and atraumatic.  Eyes:     General: No scleral icterus.       Right eye: No discharge.        Left eye: No discharge.     Extraocular Movements: Extraocular movements intact.     Conjunctiva/sclera: Conjunctivae normal.     Pupils: Pupils are equal, round, and reactive to light.  Cardiovascular:     Rate and Rhythm: Normal rate and regular rhythm.     Pulses: Normal pulses.     Heart sounds: Normal heart sounds. No murmur heard.   No friction rub. No gallop.  Pulmonary:      Effort: Pulmonary effort is normal. No respiratory distress.     Breath sounds: Normal breath sounds.  Skin:    General: Skin is warm and dry.  Neurological:     Mental Status: She is alert.  Psychiatric:        Mood and Affect: Mood normal.        Behavior: Behavior normal.        Thought Content: Thought content normal.        Judgment: Judgment normal.   Impression and Recommendations:    1. Encounter to establish care Reviewed available information and discussed care concerns with patient.   2. Weight loss counseling, encounter for Doing very well overall.  Does still have a little weight to lose to be in the normal BMI range.  Continue Wegovy 2.4 mg weekly  3. History of PCR DNA positive for HSV1 Prophylactic dosing is working well.  Continue Valtrex 1000 mg daily.  4. Anxiety Discussed using benzodiazepines along for anxiety control.  Would love to have her at least trial a low-dose of an SSRI or SNRI.  She is hesitant to do this but asked her to consider it and let me know if she changes her mind.  For now, continue very sparing use of Xanax 0.5 mg for severe anxiety only.  Patient aware to avoid using this for sleep  and to limit her dosing as much as possible.  Return for annual physical exam at your convenience. ___________________________________________ Thayer Ohm, DNP, APRN, FNP-BC Primary Care and Sports Medicine [facility] [place]

## 2021-06-26 ENCOUNTER — Other Ambulatory Visit: Payer: Self-pay

## 2021-06-26 DIAGNOSIS — F419 Anxiety disorder, unspecified: Secondary | ICD-10-CM

## 2021-06-26 DIAGNOSIS — Z8619 Personal history of other infectious and parasitic diseases: Secondary | ICD-10-CM

## 2021-06-26 DIAGNOSIS — R110 Nausea: Secondary | ICD-10-CM

## 2021-06-26 MED ORDER — VALACYCLOVIR HCL 1 G PO TABS: 1000.0000 mg | ORAL_TABLET | Freq: Every day | ORAL | 2 refills | Status: DC

## 2021-06-26 MED ORDER — PROMETHAZINE HCL 25 MG PO TABS: 25.0000 mg | ORAL_TABLET | Freq: Four times a day (QID) | ORAL | 2 refills | Status: DC | PRN

## 2021-06-26 MED ORDER — ONDANSETRON 8 MG PO TBDP: 8.0000 mg | ORAL_TABLET | Freq: Three times a day (TID) | ORAL | 1 refills | Status: DC | PRN

## 2021-06-26 NOTE — Telephone Encounter (Signed)
Theresa Wheeler called and left a message stating she is in need of refills.   Pended refills.

## 2021-07-29 ENCOUNTER — Encounter: Payer: Self-pay | Admitting: Nurse Practitioner

## 2021-07-29 ENCOUNTER — Ambulatory Visit: Payer: 59 | Admitting: Nurse Practitioner

## 2021-07-29 ENCOUNTER — Other Ambulatory Visit: Payer: Self-pay

## 2021-07-29 DIAGNOSIS — N898 Other specified noninflammatory disorders of vagina: Secondary | ICD-10-CM | POA: Diagnosis not present
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
## 2021-07-30 LAB — SURESWAB® ADVANCED VAGINITIS,TMA
CANDIDA SPECIES: NOT DETECTED
Candida glabrata: NOT DETECTED
SURESWAB(R) ADV BACTERIAL VAGINOSIS(BV),TMA: POSITIVE — AB
TRICHOMONAS VAGINALIS (TV),TMA: NOT DETECTED

## 2021-07-31 ENCOUNTER — Encounter: Payer: Self-pay | Admitting: Medical-Surgical

## 2021-07-31 ENCOUNTER — Other Ambulatory Visit: Payer: Self-pay | Admitting: Nurse Practitioner

## 2021-07-31 DIAGNOSIS — N760 Acute vaginitis: Secondary | ICD-10-CM

## 2021-07-31 MED ORDER — METRONIDAZOLE 0.75 % VA GEL: 1.0000 | Freq: Two times a day (BID) | VAGINAL | 0 refills | Status: DC

## 2021-07-31 MED ORDER — METRONIDAZOLE 0.75 % VA GEL: 1.0000 | VAGINAL | 0 refills | Status: DC

## 2021-08-09 ENCOUNTER — Other Ambulatory Visit: Payer: Self-pay | Admitting: Family Medicine

## 2021-08-09 DIAGNOSIS — Z8619 Personal history of other infectious and parasitic diseases: Secondary | ICD-10-CM

## 2021-08-25 ENCOUNTER — Encounter: Payer: Self-pay | Admitting: Nurse Practitioner

## 2021-08-26 ENCOUNTER — Other Ambulatory Visit: Payer: Self-pay | Admitting: Nurse Practitioner

## 2021-08-26 DIAGNOSIS — B9689 Other specified bacterial agents as the cause of diseases classified elsewhere: Secondary | ICD-10-CM

## 2021-08-26 MED ORDER — METRONIDAZOLE 0.75 % VA GEL: 1.0000 | VAGINAL | 3 refills | Status: DC

## 2021-08-31 ENCOUNTER — Other Ambulatory Visit: Payer: Self-pay | Admitting: Medical-Surgical

## 2021-08-31 DIAGNOSIS — F419 Anxiety disorder, unspecified: Secondary | ICD-10-CM

## 2021-09-01 ENCOUNTER — Encounter: Payer: Self-pay | Admitting: Medical-Surgical

## 2021-09-01 ENCOUNTER — Other Ambulatory Visit: Payer: Self-pay

## 2021-09-01 ENCOUNTER — Other Ambulatory Visit: Payer: Self-pay | Admitting: Medical-Surgical

## 2021-09-01 DIAGNOSIS — F419 Anxiety disorder, unspecified: Secondary | ICD-10-CM

## 2021-09-01 MED ORDER — ALPRAZOLAM 0.5 MG PO TABS: 0.2500 mg | ORAL_TABLET | Freq: Every day | ORAL | 2 refills | Status: DC | PRN

## 2021-09-05 ENCOUNTER — Other Ambulatory Visit: Payer: Self-pay | Admitting: Family Medicine

## 2021-09-05 ENCOUNTER — Other Ambulatory Visit: Payer: Self-pay | Admitting: Medical-Surgical

## 2021-09-05 ENCOUNTER — Encounter: Payer: Self-pay | Admitting: Medical-Surgical

## 2021-09-05 DIAGNOSIS — Z713 Dietary counseling and surveillance: Secondary | ICD-10-CM

## 2021-09-05 DIAGNOSIS — Z8619 Personal history of other infectious and parasitic diseases: Secondary | ICD-10-CM

## 2021-09-05 MED ORDER — WEGOVY 2.4 MG/0.75ML ~~LOC~~ SOAJ: 2.4000 mg | SUBCUTANEOUS | 0 refills | Status: DC

## 2021-09-05 MED ORDER — VALACYCLOVIR HCL 1 G PO TABS: 1000.0000 mg | ORAL_TABLET | Freq: Every day | ORAL | 0 refills | Status: DC

## 2021-09-18 ENCOUNTER — Other Ambulatory Visit: Payer: Self-pay | Admitting: Medical-Surgical

## 2021-09-29 ENCOUNTER — Other Ambulatory Visit: Payer: Self-pay | Admitting: Medical-Surgical

## 2021-09-29 DIAGNOSIS — Z713 Dietary counseling and surveillance: Secondary | ICD-10-CM

## 2021-09-29 DIAGNOSIS — Z8619 Personal history of other infectious and parasitic diseases: Secondary | ICD-10-CM

## 2021-10-01 ENCOUNTER — Ambulatory Visit (INDEPENDENT_AMBULATORY_CARE_PROVIDER_SITE_OTHER): Payer: 59 | Admitting: Nurse Practitioner

## 2021-10-01 ENCOUNTER — Other Ambulatory Visit (HOSPITAL_COMMUNITY)
Admission: RE | Admit: 2021-10-01 | Discharge: 2021-10-01 | Disposition: A | Discharge status: Home / Self Care | Payer: 59 | Source: Ambulatory Visit | Attending: Nurse Practitioner | Admitting: Nurse Practitioner

## 2021-10-01 ENCOUNTER — Encounter: Payer: Self-pay | Admitting: Nurse Practitioner

## 2021-10-01 VITALS — BP 124/74 | HR 69 | Ht 63.5 in | Wt 150.0 lb

## 2021-10-01 DIAGNOSIS — Z01419 Encounter for gynecological examination (general) (routine) without abnormal findings: Secondary | ICD-10-CM

## 2021-10-01 DIAGNOSIS — Z3009 Encounter for other general counseling and advice on contraception: Secondary | ICD-10-CM | POA: Diagnosis not present

## 2021-10-01 DIAGNOSIS — Z113 Encounter for screening for infections with a predominantly sexual mode of transmission: Secondary | ICD-10-CM

## 2021-10-01 NOTE — Progress Notes (Signed)
? ?WYLODEAN SHIMMEL Sep 12, 1990 [id number] ? ? ?History:  31 y.o. G0 presents for annual exam. Wants to discuss contraception. She had Mirena in the past but had daily spotting. IUD found to have implanted and was removed. She is interested in this again. Monthly cycles. 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 On Wegovy for weight loss management, down 75 pounds. Takes Valtrex daily for cold sore prevention. H/O recurrent BV, on suppression treatment now.  ? ?Gynecologic History ?Patient's last menstrual period was 09/22/2021. ?Period Cycle (Days): 28 ?Period Duration (Days): 5 ?Menstrual Flow: Heavy ?Menstrual Control: Maxi pad, Tampon ?Dysmenorrhea: (!) Moderate ?Dysmenorrhea Symptoms: Cramping ?Contraception/Family planning: none ?Sexually active: Yes ? ?Health Maintenance ?Last Pap: 02/24/2019. Results were: Normal ?Last mammogram: Not indicated ?Last colonoscopy: Not indicated ?Last Dexa: Not indicated ? ?Past medical history, past surgical history, family history and social history were all reviewed and documented in the EPIC chart. 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  ? ?ROS:  A ROS was performed and pertinent positives and negatives are included. ? ?Exam: ? ?Vitals:  ? 10/01/21 1440  ?BP: 124/74  ?Pulse: 69  ?SpO2: 96%  ?Weight: 150 lb (68 kg)  ?Height: 5' 3.5" (1.613 m)  ? ?Body mass index is 26.15 kg/m?. ? ?General appearance:  Normal ?Thyroid:  Symmetrical, normal in size, without palpable masses or nodularity. ?Respiratory ? Auscultation:  Clear without wheezing or rhonchi ?Cardiovascular ? Auscultation:  Regular rate, without rubs, murmurs or gallops ? Edema/varicosities:  Not grossly evident ?Abdominal ? Soft,nontender, without masses, guarding or rebound. ? Liver/spleen:  No organomegaly noted ? Hernia:  None appreciated ? Skin ? Inspection:  Grossly normal ?Breasts: Examined lying and sitting.  ? Right: Without masses, retractions, nipple discharge or axillary adenopathy. ? ? Left: Without masses, retractions, nipple discharge or axillary  adenopathy. ?Genitourinary  ? Inguinal/mons:  Normal without inguinal adenopathy ? External genitalia:  Normal appearing vulva with no masses, tenderness, or lesions ? BUS/Urethra/Skene's glands:  Normal ? Vagina:  Normal appearing with normal color and discharge, no lesions ? Cervix:  Normal appearing without discharge or lesions ? Uterus:  Normal in size, shape and contour.  Midline and mobile, nontender ? Adnexa/parametria:   ?  Rt: Normal in size, without masses or tenderness. ?  Lt: Normal in size, without masses or tenderness. ? Anus and perineum: Normal ? Digital rectal exam: Not indicated ? ?Patient informed chaperone available to be present for breast and pelvic exam. Patient has requested no chaperone to be present. Patient has been advised what will be completed during breast and pelvic exam.  ? ?Assessment/Plan:  31 y.o. G0 for annual exam.  ? ?Well female exam with routine gynecological exam - Plan: Cytology - PAP( [facility]). Education provided on SBEs, importance of preventative screenings, current guidelines, high calcium diet, regular exercise, and multivitamin daily. Congratulated on weight loss. Labs with PCP.  ? ?Screen for STD (sexually transmitted disease) - Plan: Cytology - PAP( [place]), RPR, HIV Antibody (routine testing w rflx). GC, chlamydia, trich added to pap.  ? ?General counseling and advice on female contraception - Contraceptive options were reviewed, including hormonal methods, both combination (pill, patch, vaginal ring) and progesterone-only (pill, Depo Provera and Nexplanon), intrauterine devices (Mirena, [place], [place], and [place]), Phexxi, barrier methods (condoms, diaphragm) and female/female sterilization. The mechanisms, risks, benefits and side effects of all methods were discussed. Considering Mirena IUD. She is nervous due to previous experience. We discussed what to expect and option for Valium prior to procedure. Will call to schedule during menses if  she  decides.  ? ?Screening for cervical cancer - 2018 CIN-1. Pap with HR HPV today.  ? ?Return in 1 year for annual.  ? ? ? ?Olivia Mackie DNP, 3:00 PM 10/01/2021 ? ?

## 2021-10-02 LAB — RPR: RPR Ser Ql: NONREACTIVE

## 2021-10-02 LAB — HIV ANTIBODY (ROUTINE TESTING W REFLEX): HIV 1&2 Ab, 4th Generation: NONREACTIVE

## 2021-10-06 LAB — CYTOLOGY - PAP
Chlamydia: NEGATIVE
Comment: NEGATIVE
Comment: NEGATIVE
Comment: NEGATIVE
Comment: NORMAL
Diagnosis: NEGATIVE
High risk HPV: POSITIVE — AB
Neisseria Gonorrhea: NEGATIVE
Trichomonas: NEGATIVE

## 2021-11-05 ENCOUNTER — Telehealth: Payer: Self-pay

## 2021-11-05 NOTE — Telephone Encounter (Addendum)
Initiated Prior authorization CQF:JUVQQU 2.4 MG/0.75ML SOAJ  Via: Covermymeds Case/Key:[id number] Status: denied  as of 11/05/21 Reason:You do not meet the requirements of your plan. The information we received indicated that this request for coverage is to continue therapy on this medication. Current plan approved criteria covers this drug when you have been taking it for at least three months at a stable maintenance dose and met any of these conditions: - You are at least 61 years of age or older - You lost at least 5 percent of your body weight - You have continued to keep your initial 5 percent weight loss off Supporting documentation must be submitted. Your request has been denied based on the information we have. Notified Pt via: Mychart

## 2021-12-20 ENCOUNTER — Emergency Department: Admit: 2021-12-20 | Payer: 59

## 2021-12-20 ENCOUNTER — Emergency Department
Admission: EM | Admit: 2021-12-20 | Discharge: 2021-12-20 | Disposition: A | Discharge status: Home / Self Care | Payer: 59 | Attending: Family Medicine | Admitting: Family Medicine

## 2021-12-20 ENCOUNTER — Other Ambulatory Visit: Payer: Self-pay

## 2021-12-20 DIAGNOSIS — R519 Headache, unspecified: Secondary | ICD-10-CM | POA: Diagnosis not present

## 2021-12-20 MED ORDER — BUTALBITAL-APAP-CAFFEINE 50-325-40 MG PO TABS: 1.0000 | ORAL_TABLET | Freq: Four times a day (QID) | ORAL | 0 refills | Status: DC | PRN

## 2021-12-20 MED ORDER — RIZATRIPTAN BENZOATE 10 MG PO TABS: 10.0000 mg | ORAL_TABLET | ORAL | 0 refills | Status: DC | PRN

## 2021-12-20 NOTE — ED Triage Notes (Signed)
Pt presents to Urgent Care with c/o worsening headache x 5 days, despite taking ibuprofen and Tylenol "around the clock." Reports sensitivity to sound and light. No hx of migraines. Reports intermittent blurred vision in her L eye over the past several days. No numbness/tingling, smile is symmetrical, tongue midline, no problem w/ movement, and equal grips.
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
## 2021-12-20 NOTE — Discharge Instructions (Addendum)
Advised patient to take medication as directed with food.  Advised may use Fioricet for breakthrough left frontal headache/migraine pain.  Encouraged patient to increase daily water intake while taking these medications.  Advised patient if symptoms worsen and/or unresolved please follow-up with PCP or here for further evaluation.

## 2021-12-21 ENCOUNTER — Telehealth: Payer: Self-pay

## 2021-12-21 NOTE — Telephone Encounter (Signed)
TC to f/u with pt after yesterday's visit to [facility]. She reports no improvement in headache severity after taking medications prescribed by M. Ave Filter, FNP yesterday. Reported information to M. Ragan, FNP, who advised to encourage pt to f/u here or ED for CT scan of head. Relayed info to pt and she verbalizes understanding.

## 2021-12-23 ENCOUNTER — Other Ambulatory Visit: Payer: Self-pay | Admitting: Medical-Surgical

## 2021-12-24 NOTE — Telephone Encounter (Signed)
Patient needs an appointment for further refills.  Last office visit 06/19/2021  Last filled 09/19/2021

## 2021-12-24 NOTE — Telephone Encounter (Signed)
LVM for patient to call back to schedule appt with PCP. AMUCK 

## 2021-12-29 ENCOUNTER — Other Ambulatory Visit (HOSPITAL_BASED_OUTPATIENT_CLINIC_OR_DEPARTMENT_OTHER): Payer: Self-pay

## 2022-02-10 IMAGING — MR MR KNEE*L* W/O CM
6 of 7 series · 29 of 40 positions shown · non-contrast
Comparison: MRI left knee dated April 20, 2020.

CLINICAL DATA: Continued left knee pain since partial discectomy in
[place].

EXAM:
MRI OF THE LEFT KNEE WITHOUT CONTRAST
TECHNIQUE: Multiplanar, multisequence MR imaging of the knee was performed. No
intravenous contrast was administered.

[Series 3: T2 fat-sat · axial · 4.0mm · 0.53mm/px · z∈[-77,+68]mm · 6 of 30 slices shown (1 of 3)]
[im 1/30]
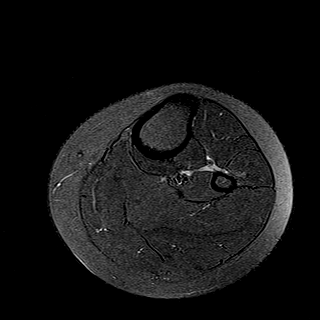
[im 6/30]
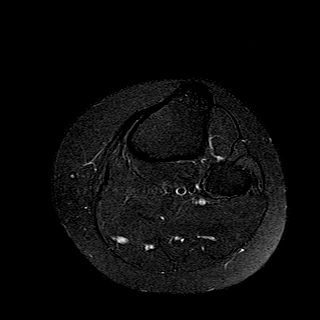
[im 12/30]
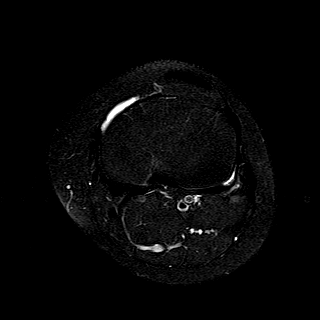
[im 18/30]
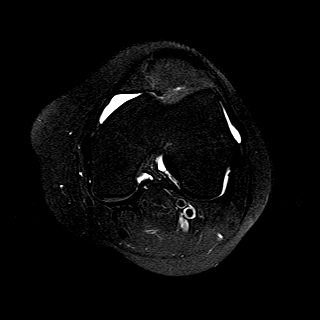
[im 24/30]
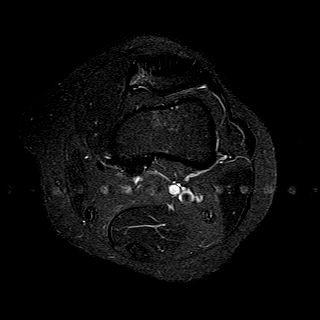
[im 30/30]
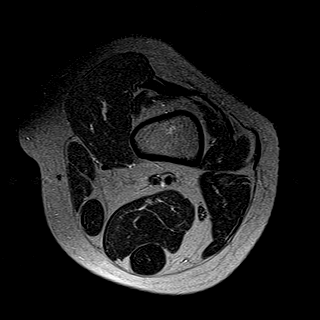

[Series 5: T2 fat-sat · coronal · 4.0mm · 0.29mm/px · 6 of 26 slices shown (2 of 3)]
[im 1/26]
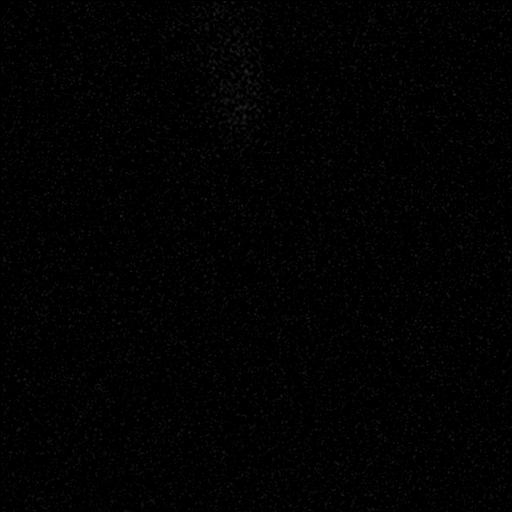
[im 6/26]
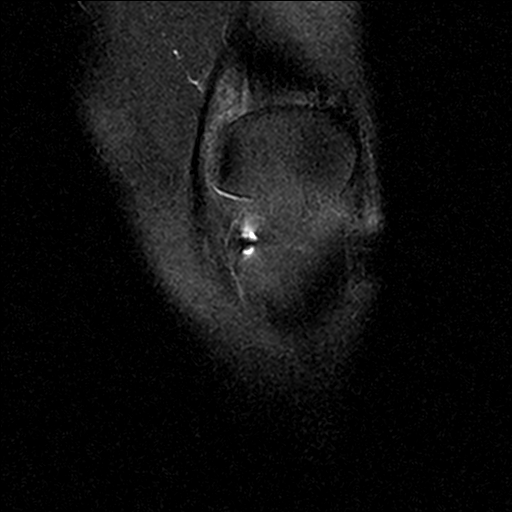
[im 11/26]
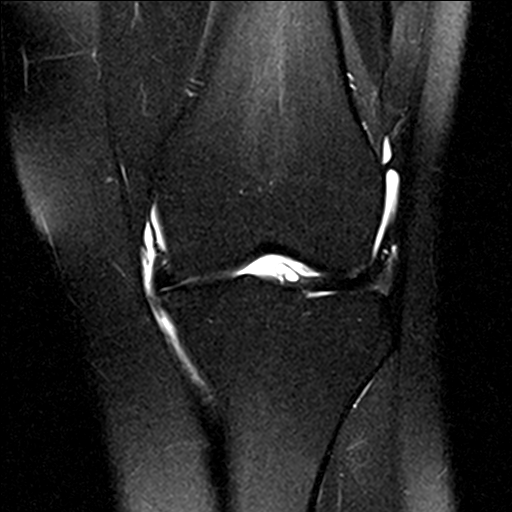
[im 16/26]
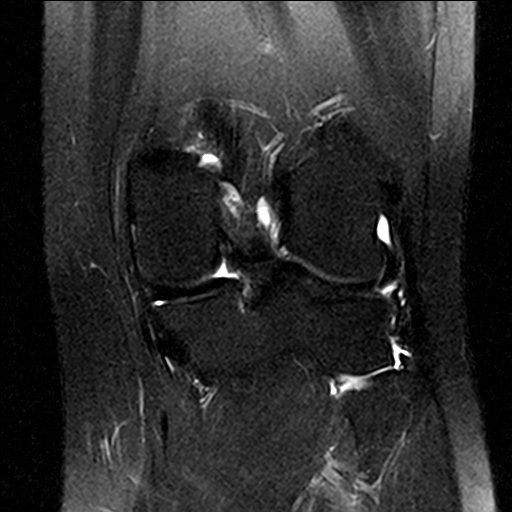
[im 21/26]
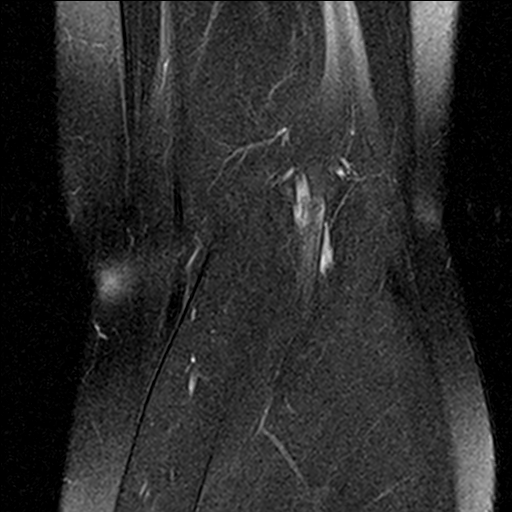
[im 26/26]
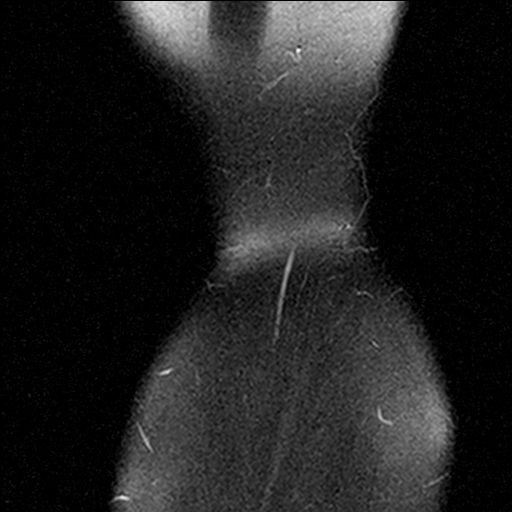

[Series 6: PD fat-sat · coronal · 4.0mm · 0.59mm/px · 6 of 26 slices shown (1 of 3)]
[im 1/26]
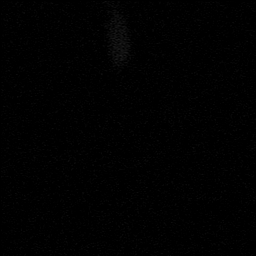
[im 6/26]
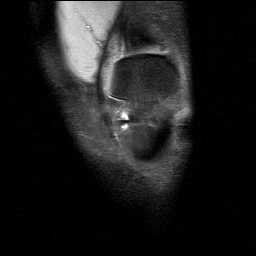
[im 11/26]
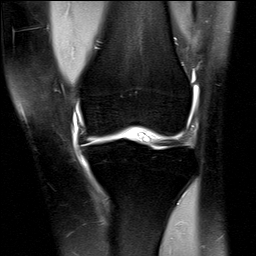
[im 16/26]
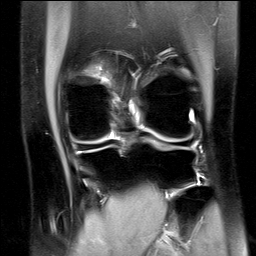
[im 21/26]
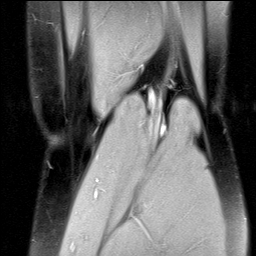
[im 26/26]
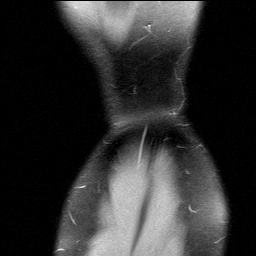

[Series 7: PD fat-sat · sagittal · 3.0mm · 0.29mm/px · 6 of 30 slices shown (2 of 3)]
[im 1/30]
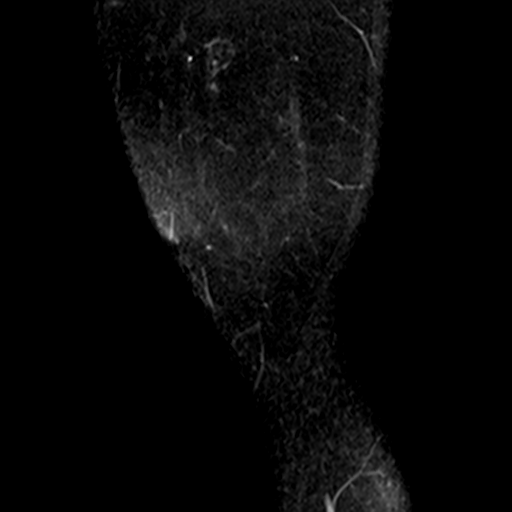
[im 6/30]
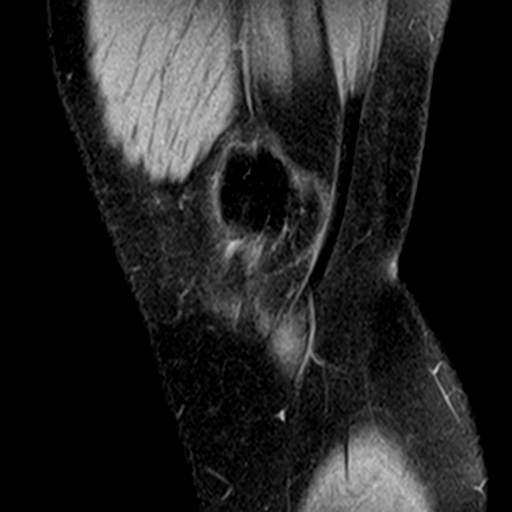
[im 12/30]
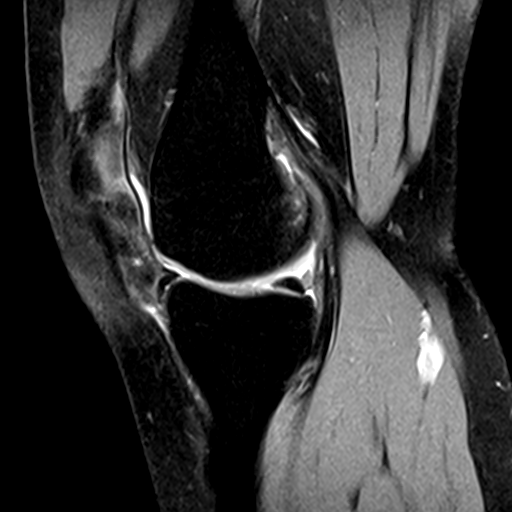
[im 18/30]
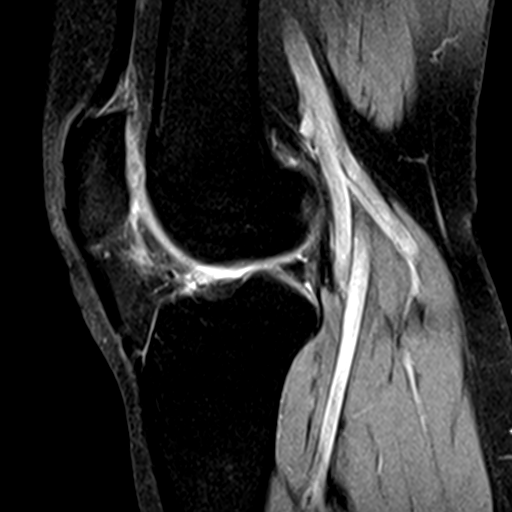
[im 24/30]
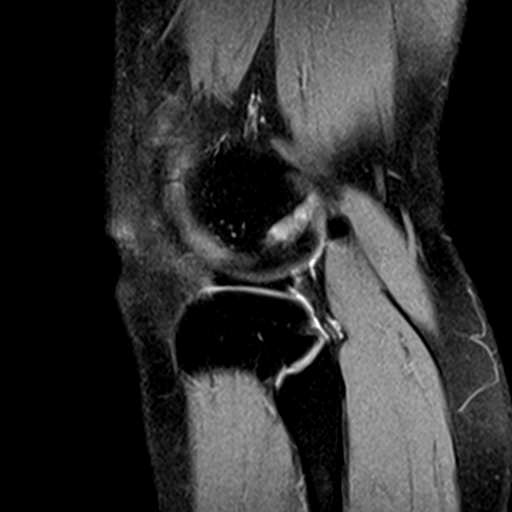
[im 30/30]
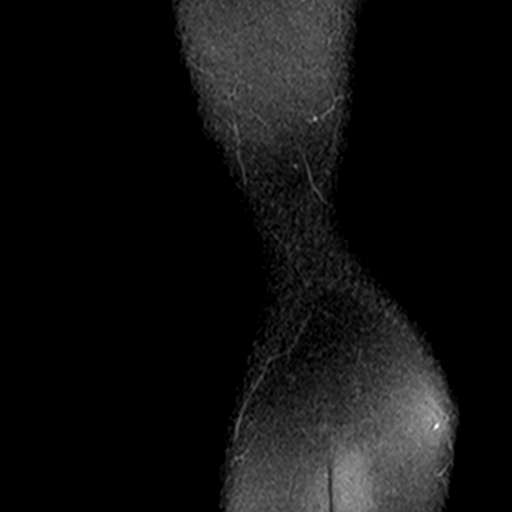

[Series 8: T2 fat-sat · sagittal · 3.0mm · 0.29mm/px · 1 of 30 slices shown (3 of 3)]
[im 1/30]
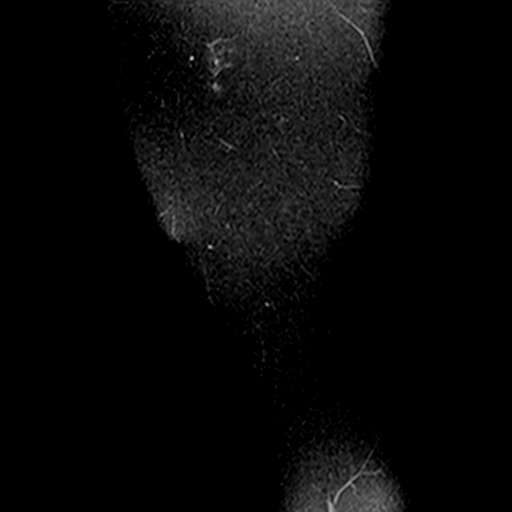

[Series 9: PD fat-sat · oblique · 2.0mm · 0.59mm/px · 4 of 21 slices shown (3 of 3)]
[im 1/21]
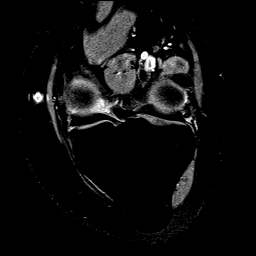
[im 7/21]
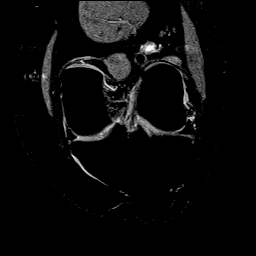
[im 14/21]
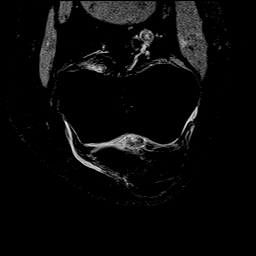
[im 21/21]
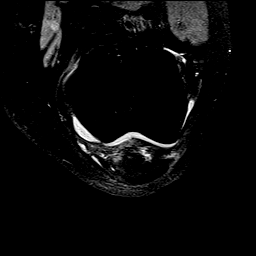

[29 of 40 positions shown; findings below may reference images not displayed]

FINDINGS: MENISCI

Medial meniscus: New blunting of the posterior body and horn free
edge related to interval partial meniscectomy. Otherwise intact.

Lateral meniscus:  Intact.

LIGAMENTS

Cruciates:  Intact ACL and PCL.

Collaterals: Medial collateral ligament is intact. Lateral
collateral ligament complex is intact.

CARTILAGE

Patellofemoral:  No chondral defect.

Medial:  No chondral defect.

Lateral:  No chondral defect.

Joint:  No joint effusion.  Postsurgical changes within Hoffa's fat.

Popliteal Fossa:  No Baker cyst. Intact popliteus tendon.

Extensor Mechanism: Intact quadriceps tendon and patellar tendon.
Intact medial and lateral patellar retinaculum. Intact MPFL.

Bones: No focal marrow signal abnormality. No fracture or
dislocation.

Other: None.
IMPRESSION: 1. New blunting of the posterior body and horn free edge related to
interval partial meniscectomy. No recurrent meniscal tear.

## 2022-04-02 ENCOUNTER — Other Ambulatory Visit: Payer: Self-pay | Admitting: Medical-Surgical

## 2022-04-02 ENCOUNTER — Telehealth: Payer: Self-pay | Admitting: Medical-Surgical

## 2022-04-02 DIAGNOSIS — Z8619 Personal history of other infectious and parasitic diseases: Secondary | ICD-10-CM

## 2022-04-02 NOTE — Telephone Encounter (Signed)
Called patient, left message for her to call back to schedule her annual.  Tvt

## 2022-04-02 NOTE — Telephone Encounter (Signed)
Patient scheduled physical in December but needing Valtrex refill can you send over to pharmacy prior to her appointment.

## 2022-04-07 ENCOUNTER — Telehealth: Payer: 59 | Admitting: Family Medicine

## 2022-04-07 DIAGNOSIS — B3731 Acute candidiasis of vulva and vagina: Secondary | ICD-10-CM | POA: Diagnosis not present

## 2022-04-07 MED ORDER — FLUCONAZOLE 150 MG PO TABS: 150.0000 mg | ORAL_TABLET | ORAL | 0 refills | Status: DC

## 2022-04-07 NOTE — Patient Instructions (Signed)
Theresa Wheeler, thank you for joining [name], NP for today's virtual visit.  While this provider is not your primary care provider (PCP), if your PCP is located in our provider database this encounter information will be shared with them immediately following your visit.   A [facility] MyChart account gives you access to today's visit and all your visits, tests, and labs performed at [facility] " click here if you don't have a [facility] MyChart account or go to [email or web address]  Consent: (Patient) Theresa Wheeler provided verbal consent for this virtual visit at the beginning of the encounter.  Current Medications:  Current Outpatient Medications:    fluconazole (DIFLUCAN) 150 MG tablet, Take 1 tablet (150 mg total) by mouth as directed. Repeat in 3 days as needed, Disp: 1 tablet, Rfl: 0   ALPRAZolam (XANAX) 0.5 MG tablet, Take 0.5-1 tablets (0.25-0.5 mg total) by mouth daily as needed for anxiety., Disp: 30 tablet, Rfl: 2   butalbital-acetaminophen-caffeine (FIORICET) 50-325-40 MG tablet, Take 1-2 tablets by mouth every 6 (six) hours as needed for headache., Disp: 30 tablet, Rfl: 0   metroNIDAZOLE (METROGEL VAGINAL) 0.75 % vaginal gel, Place 1 Applicatorful vaginally 2 (two) times daily., Disp: , Rfl:    ondansetron (ZOFRAN-ODT) 8 MG disintegrating tablet, TAKE 1 TABLET BY MOUTH EVERY 8 HOURS AS NEEDED FOR NAUSEA OR VOMITING., Disp: 20 tablet, Rfl: 1   promethazine (PHENERGAN) 25 MG tablet, Take 1 tablet (25 mg total) by mouth every 6 (six) hours as needed for nausea or vomiting., Disp: 40 tablet, Rfl: 2   rizatriptan (MAXALT) 10 MG tablet, Take 1 tablet (10 mg total) by mouth as needed for migraine. May repeat in 2 hours if needed, Disp: 10 tablet, Rfl: 0   valACYclovir (VALTREX) 1000 MG tablet, TAKE ONE TABLET BY MOUTH ONE TIME DAILY, Disp: 30 tablet, Rfl: 0   WEGOVY 2.4 MG/0.75ML SOAJ, INJECT 1 PEN (2.4 MG) INTO THE SKIN ONCE A WEEK., Disp: 9 mL, Rfl: 1    Medications ordered in this encounter:  Meds ordered this encounter  Medications   fluconazole (DIFLUCAN) 150 MG tablet    Sig: Take 1 tablet (150 mg total) by mouth as directed. Repeat in 3 days as needed    Dispense:  1 tablet    Refill:  0    Order Specific Question:   Supervising Provider    Answer:   Merrilee Jansky [[id number]]     *If you need refills on other medications prior to your next appointment, please contact your pharmacy*  Follow-Up: Call back or seek an in-person evaluation if the symptoms worsen or if the condition fails to improve as anticipated.  [facility] [phone]  Other Instructions Vaginal Yeast Infection, Adult  Vaginal yeast infection is a condition that causes vaginal discharge as well as soreness, swelling, and redness (inflammation) of the vagina. This is a common condition. Some women get this infection frequently. What are the causes? This condition is caused by a change in the normal balance of the yeast (Candida) and normal bacteria that live in the vagina. This change causes an overgrowth of yeast, which causes the inflammation. What increases the risk? The condition is more likely to develop in women who: Take antibiotic medicines. Have diabetes. Take birth control pills. Are pregnant. Douche often. Have a weak body defense system (immune system). Have been taking steroid medicines for a long time. Frequently wear tight clothing. What are the signs or symptoms? Symptoms  of this condition include: White, thick, creamy vaginal discharge. Swelling, itching, redness, and irritation of the vagina. The lips of the vagina (labia) may be affected as well. Pain or a burning feeling while urinating. Pain during sex. How is this diagnosed? This condition is diagnosed based on: Your medical history. A physical exam. A pelvic exam. Your health care provider will examine a sample of your vaginal discharge under a microscope.  Your health care provider may send this sample for testing to confirm the diagnosis. How is this treated? This condition is treated with medicine. Medicines may be over-the-counter or prescription. You may be told to use one or more of the following: Medicine that is taken by mouth (orally). Medicine that is applied as a cream (topically). Medicine that is inserted directly into the vagina (suppository). Follow these instructions at home: Take or apply over-the-counter and prescription medicines only as told by your health care provider. Do not use tampons until your health care provider approves. Do not have sex until your infection has cleared. Sex can prolong or worsen your symptoms of infection. Ask your health care provider when it is safe to resume sexual activity. Keep all follow-up visits. This is important. How is this prevented?  Do not wear tight clothes, such as pantyhose or tight pants. Wear breathable cotton underwear. Do not use douches, perfumed soap, creams, or powders. Wipe from front to back after using the toilet. If you have diabetes, keep your blood sugar levels under control. Ask your health care provider for other ways to prevent yeast infections. Contact a health care provider if: You have a fever. Your symptoms go away and then return. Your symptoms do not get better with treatment. Your symptoms get worse. You have new symptoms. You develop blisters in or around your vagina. You have blood coming from your vagina and it is not your menstrual period. You develop pain in your abdomen. Summary Vaginal yeast infection is a condition that causes discharge as well as soreness, swelling, and redness (inflammation) of the vagina. This condition is treated with medicine. Medicines may be over-the-counter or prescription. Take or apply over-the-counter and prescription medicines only as told by your health care provider. Do not douche. Resume sexual activity or use of  tampons as instructed by your health care provider. Contact a health care provider if your symptoms do not get better with treatment or your symptoms go away and then return. This information is not intended to replace advice given to you by your health care provider. Make sure you discuss any questions you have with your health care provider. Document Revised: 07/29/2020 Document Reviewed: 07/29/2020 Elsevier Patient Education  2023 Elsevier Inc.   If you have been instructed to have an in-person evaluation today at a local Urgent Care facility, please use the link below. It will take you to a list of all of our available [facility], including address, phone number and hours of operation. Please do not delay care.  [facility]  If you or a family member do not have a primary care provider, use the link below to schedule a visit and establish care. When you choose a [facility] primary care physician or advanced practice provider, you gain a long-term partner in health. Find a Primary Care Provider  Learn more about [facility]'s in-office and virtual care options: [facility] - Get Care Now

## 2022-04-07 NOTE — Progress Notes (Signed)
Virtual Visit Consent   [name], you are scheduled for a virtual visit with a  provider today. Just as with appointments in the office, your consent must be obtained to participate. Your consent will be active for this visit and any virtual visit you may have with one of our providers in the next 365 days. If you have a MyChart account, a copy of this consent can be sent to you electronically.  As this is a virtual visit, video technology does not allow for your provider to perform a traditional examination. This may limit your provider's ability to fully assess your condition. If your provider identifies any concerns that need to be evaluated in person or the need to arrange testing (such as labs, EKG, etc.), we will make arrangements to do so. Although advances in technology are sophisticated, we cannot ensure that it will always work on either your end or our end. If the connection with a video visit is poor, the visit may have to be switched to a telephone visit. With either a video or telephone visit, we are not always able to ensure that we have a secure connection.  By engaging in this virtual visit, you consent to the provision of healthcare and authorize for your insurance to be billed (if applicable) for the services provided during this visit. Depending on your insurance coverage, you may receive a charge related to this service.  I need to obtain your verbal consent now. Are you willing to proceed with your visit today? SOLE LENGACHER has provided verbal consent on 04/07/2022 for a virtual visit (video or telephone). [name], NP  Date: 04/07/2022 12:43 PM  Virtual Visit via Video Note   I, [name], connected with  Theresa Wheeler  ([id number], 09-10-1990) on 04/07/22 at  1:15 PM EST by a video-enabled telemedicine application and verified that I am speaking with the correct person using two identifiers.  Location: Patient: Virtual Visit Location  Patient: Home Provider: Virtual Visit Location Provider: Home Office   I discussed the limitations of evaluation and management by telemedicine and the availability of in person appointments. The patient expressed understanding and agreed to proceed.    History of Present Illness: Theresa Wheeler is a 31 y.o. who identifies as a female who was assigned female at birth, and is being seen today for yeast infection.  HPI: Vaginal Itching The patient's primary symptoms include genital itching. The patient's pertinent negatives include no genital lesions, genital odor, genital rash, missed menses, pelvic pain, vaginal bleeding or vaginal discharge. This is a new problem. The current episode started yesterday. The problem occurs constantly. The problem has been gradually worsening. The patient is experiencing no pain. She is not pregnant. Pertinent negatives include no abdominal pain, anorexia, back pain, chills, constipation, diarrhea, discolored urine, dysuria, fever, flank pain, frequency, headaches, hematuria, joint pain, joint swelling, nausea, painful intercourse, rash, sore throat, urgency or vomiting. Nothing aggravates the symptoms. She has tried nothing for the symptoms. The treatment provided no relief. She is sexually active. No, her partner does not have an STD. She uses vasectomy for contraception. Her menstrual history has been regular.    Problems:  Patient Active Problem List   Diagnosis Date Noted   Tear of medial meniscus of left knee 04/15/2020   Weight loss counseling, encounter for 01/18/2020   Screening for tuberculosis 01/18/2020   Anxiety 12/30/2017   History of PCR DNA positive for HSV1 12/30/2017  Allergies:  Allergies  Allergen Reactions   Metoclopramide Other (See Comments)    dystonia Other reaction(s): Psychosis (intolerance)   Medications:  Current Outpatient Medications:    ALPRAZolam (XANAX) 0.5 MG tablet, Take 0.5-1 tablets (0.25-0.5 mg total) by mouth  daily as needed for anxiety., Disp: 30 tablet, Rfl: 2   butalbital-acetaminophen-caffeine (FIORICET) 50-325-40 MG tablet, Take 1-2 tablets by mouth every 6 (six) hours as needed for headache., Disp: 30 tablet, Rfl: 0   metroNIDAZOLE (METROGEL VAGINAL) 0.75 % vaginal gel, Place 1 Applicatorful vaginally 2 (two) times daily., Disp: , Rfl:    ondansetron (ZOFRAN-ODT) 8 MG disintegrating tablet, TAKE 1 TABLET BY MOUTH EVERY 8 HOURS AS NEEDED FOR NAUSEA OR VOMITING., Disp: 20 tablet, Rfl: 1   promethazine (PHENERGAN) 25 MG tablet, Take 1 tablet (25 mg total) by mouth every 6 (six) hours as needed for nausea or vomiting., Disp: 40 tablet, Rfl: 2   rizatriptan (MAXALT) 10 MG tablet, Take 1 tablet (10 mg total) by mouth as needed for migraine. May repeat in 2 hours if needed, Disp: 10 tablet, Rfl: 0   valACYclovir (VALTREX) 1000 MG tablet, TAKE ONE TABLET BY MOUTH ONE TIME DAILY, Disp: 30 tablet, Rfl: 0   WEGOVY 2.4 MG/0.75ML SOAJ, INJECT 1 PEN (2.4 MG) INTO THE SKIN ONCE A WEEK., Disp: 9 mL, Rfl: 1  Observations/Objective: Patient is well-developed, well-nourished in no acute distress.  Resting comfortably  at home.  Head is normocephalic, atraumatic.  No labored breathing.  Speech is clear and coherent with logical content.  Patient is alert and oriented at baseline.    Assessment and Plan:  1. Vaginal yeast infection  - fluconazole (DIFLUCAN) 150 MG tablet; Take 1 tablet (150 mg total) by mouth as directed. Repeat in 3 days as needed  Dispense: 1 tablet; Refill: 0  -review of prevent, info on AVS   Reviewed side effects, risks and benefits of medication.    Patient acknowledged agreement and understanding of the plan.   Past Medical, Surgical, Social History, Allergies, and Medications have been Reviewed.    Follow Up Instructions: I discussed the assessment and treatment plan with the patient. The patient was provided an opportunity to ask questions and all were answered. The patient  agreed with the plan and demonstrated an understanding of the instructions.  A copy of instructions were sent to the patient via MyChart unless otherwise noted below.     The patient was advised to call back or seek an in-person evaluation if the symptoms worsen or if the condition fails to improve as anticipated.  Time:  I spent 7 minutes with the patient via telehealth technology discussing the above problems/concerns.    Perlie Mayo, NP

## 2022-05-01 ENCOUNTER — Encounter: Payer: Self-pay | Admitting: Medical-Surgical

## 2022-05-01 ENCOUNTER — Ambulatory Visit (INDEPENDENT_AMBULATORY_CARE_PROVIDER_SITE_OTHER): Payer: 59 | Admitting: Medical-Surgical

## 2022-05-01 VITALS — BP 112/73 | HR 83 | Resp 20 | Ht 63.0 in | Wt 160.3 lb

## 2022-05-01 DIAGNOSIS — Z1329 Encounter for screening for other suspected endocrine disorder: Secondary | ICD-10-CM

## 2022-05-01 DIAGNOSIS — Z13220 Encounter for screening for lipoid disorders: Secondary | ICD-10-CM | POA: Diagnosis not present

## 2022-05-01 DIAGNOSIS — Z0000 Encounter for general adult medical examination without abnormal findings: Secondary | ICD-10-CM

## 2022-05-01 DIAGNOSIS — F902 Attention-deficit hyperactivity disorder, combined type: Secondary | ICD-10-CM

## 2022-05-01 DIAGNOSIS — R69 Illness, unspecified: Secondary | ICD-10-CM | POA: Diagnosis not present

## 2022-05-01 MED ORDER — LISDEXAMFETAMINE DIMESYLATE 30 MG PO CAPS: 30.0000 mg | ORAL_CAPSULE | Freq: Every day | ORAL | 0 refills | Status: DC

## 2022-05-01 MED ORDER — AMPHETAMINE-DEXTROAMPHET ER 10 MG PO CP24: 10.0000 mg | ORAL_CAPSULE | Freq: Every day | ORAL | 0 refills | Status: DC

## 2022-05-01 NOTE — Progress Notes (Signed)
Complete physical exam  Patient: Theresa Wheeler   DOB: [birth year]   31 y.o. Female  MRN: [id number]  Subjective:    Chief Complaint  Patient presents with   Annual Exam    Theresa Wheeler is a 31 y.o. female who presents today for a complete physical exam. She reports consuming a general diet.  Cross fit 5-6 times weekly.  She generally feels well. She reports sleeping fairly well. She does have additional problems to discuss today.   ADHD: long history of ADHD, combined type. 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 Started NP school a few months ago and is now having significant difficulty with focus and task completion. Interested in restarting medication.   Most recent fall risk assessment:    06/19/2021    8:24 AM  [place] in the past year? 0  Number falls in past yr: 0  Injury with Fall? 0  Risk for fall due to : No Fall Risks  Follow up Falls evaluation completed     Most recent depression screenings:    05/01/2022   11:06 AM 06/19/2021    8:24 AM  PHQ 2/9 Scores  PHQ - 2 Score 0 0    Vision:Not within last year , Dental: No current dental problems and Receives regular dental care, and STD: The patient denies history of sexually transmitted disease.    Patient Care Team: Samuel Bouche, NP as PCP - General (Nurse Practitioner)   Outpatient Medications Prior to Visit  Medication Sig   fluconazole (DIFLUCAN) 150 MG tablet Take 1 tablet (150 mg total) by mouth as directed. Repeat in 3 days as needed   metroNIDAZOLE (METROGEL VAGINAL) 0.75 % vaginal gel Place 1 Applicatorful vaginally 2 (two) times daily.   ondansetron (ZOFRAN-ODT) 8 MG disintegrating tablet TAKE 1 TABLET BY MOUTH EVERY 8 HOURS AS NEEDED FOR NAUSEA OR VOMITING.   valACYclovir (VALTREX) 1000 MG tablet TAKE ONE TABLET BY MOUTH ONE TIME DAILY   [DISCONTINUED] ALPRAZolam (XANAX) 0.5  MG tablet Take 0.5-1 tablets (0.25-0.5 mg total) by mouth daily as needed for anxiety. (Patient not taking: Reported on 05/01/2022)   [DISCONTINUED] butalbital-acetaminophen-caffeine (FIORICET) 50-325-40 MG tablet Take 1-2 tablets by mouth every 6 (six) hours as needed for headache. (Patient not taking: Reported on 05/01/2022)   [DISCONTINUED] promethazine (PHENERGAN) 25 MG tablet Take 1 tablet (25 mg total) by mouth every 6 (six) hours as needed for nausea or vomiting. (Patient not taking: Reported on 05/01/2022)   [DISCONTINUED] rizatriptan (MAXALT) 10 MG tablet Take 1 tablet (10 mg total) by mouth as needed for migraine. May repeat in 2 hours if needed (Patient not taking: Reported on 05/01/2022)   [DISCONTINUED] WEGOVY 2.4 MG/0.75ML SOAJ INJECT 1 PEN (2.4 MG) INTO THE SKIN ONCE A WEEK. (Patient not taking: Reported on 05/01/2022)   No facility-administered medications prior to visit.   Review of Systems  Constitutional:  Negative for chills, fever, malaise/fatigue and weight loss.  HENT:  Negative for congestion, ear pain, hearing loss, sinus pain and sore throat.   Eyes:  Negative for blurred vision, photophobia and pain.  Respiratory:  Negative for cough, shortness of breath and wheezing.   Cardiovascular:  Negative for chest pain, palpitations and leg swelling.  Gastrointestinal:  Negative for abdominal pain, constipation, diarrhea, heartburn, nausea and vomiting.  Genitourinary:  Negative for dysuria, frequency and urgency.  Musculoskeletal:  Negative  for falls and neck pain.  Skin:  Negative for itching and rash.  Neurological:  Negative for dizziness, weakness and headaches.  Endo/Heme/Allergies:  Negative for polydipsia. Does not bruise/bleed easily.  Psychiatric/Behavioral:  Negative for depression, substance abuse and suicidal ideas. The patient is nervous/anxious and has insomnia.      Objective:    BP 112/73 (BP Location: Right Arm, Cuff Size: Normal)   Pulse 83   Resp 20   Ht 5'  3" (1.6 m)   Wt 160 lb 4.8 oz (72.7 kg)   SpO2 99%   BMI 28.40 kg/m    Physical Exam Vitals reviewed.  Constitutional:      General: She is not in acute distress.    Appearance: Normal appearance. She is not ill-appearing.  HENT:     Head: Normocephalic and atraumatic.     Right Ear: Tympanic membrane, ear canal and external ear normal. There is no impacted cerumen.     Left Ear: Tympanic membrane, ear canal and external ear normal. There is no impacted cerumen.     Nose: Nose normal. No congestion or rhinorrhea.     Mouth/Throat:     Mouth: Mucous membranes are moist.     Pharynx: No oropharyngeal exudate or posterior oropharyngeal erythema.  Eyes:     General: No scleral icterus.       Right eye: No discharge.        Left eye: No discharge.     Extraocular Movements: Extraocular movements intact.     Conjunctiva/sclera: Conjunctivae normal.     Pupils: Pupils are equal, round, and reactive to light.  Neck:     Thyroid: No thyromegaly.     Vascular: No carotid bruit or JVD.     Trachea: Trachea normal.  Cardiovascular:     Rate and Rhythm: Normal rate and regular rhythm.     Pulses: Normal pulses.     Heart sounds: Normal heart sounds. No murmur heard.    No friction rub. No gallop.  Pulmonary:     Effort: Pulmonary effort is normal. No respiratory distress.     Breath sounds: Normal breath sounds. No wheezing.  Abdominal:     General: Bowel sounds are normal. There is no distension.     Palpations: Abdomen is soft.     Tenderness: There is no abdominal tenderness. There is no guarding.  Musculoskeletal:        General: Normal range of motion.     Cervical back: Normal range of motion and neck supple.  Skin:    General: Skin is warm and dry.  Neurological:     Mental Status: She is alert and oriented to person, place, and time.     Cranial Nerves: No cranial nerve deficit.  Psychiatric:        Mood and Affect: Mood normal.        Behavior: Behavior normal.         Thought Content: Thought content normal.        Judgment: Judgment normal.      No results found for any visits on 05/01/22.     Assessment & Plan:    Routine Health Maintenance and Physical Exam  Immunization History  Administered Date(s) Administered   Influenza,inj,Quad PF,6+ Mos 03/23/2021   Influenza-Unspecified 05/25/2016, 02/21/2018, 03/09/2019, 03/08/2022   PFIZER(Purple Top)SARS-COV-2 Vaccination 05/26/2019, 06/16/2019, 01/20/2020   PPD Test 01/18/2020   Pfizer Covid-19 Vaccine Bivalent Booster 46yrs & up 03/22/2021   Tdap 05/25/2013, 04/12/2020  Health Maintenance  Topic Date Due   COVID-19 Vaccine (5 - 2023-24 season) 05/17/2022 (Originally 01/23/2022)   PAP SMEAR-Modifier  10/01/2024   DTaP/Tdap/Td (3 - Td or Tdap) 04/12/2030   INFLUENZA VACCINE  Completed   Hepatitis C Screening  Completed   HIV Screening  Completed   HPV VACCINES  Aged Out    Discussed health benefits of physical activity, and encouraged her to engage in regular exercise appropriate for her age and condition.  1. Annual physical exam Checking labs as below. UTD on preventative care. Wellness information provided with AVS.  - Lipid panel - COMPLETE METABOLIC PANEL WITH GFR - CBC with Differential/Platelet  2. Thyroid disorder screen Checking TSH. - TSH  3. Lipid screening Checking lipids.  - Lipid panel  4. ADHD (attention deficit hyperactivity disorder), combined type Discussed options. Since she does have some emotional eating/overeating behavior, starting with Vyvanse 30mg  daily.   Return in about 4 weeks (around 05/29/2022) for ADHD follow up.   Samuel Bouche, NP

## 2022-05-02 LAB — CBC WITH DIFFERENTIAL/PLATELET
Absolute Monocytes: 448 cells/uL (ref 200–950)
Basophils Absolute: 28 cells/uL (ref 0–200)
Basophils Relative: 0.5 %
Eosinophils Absolute: 78 cells/uL (ref 15–500)
Eosinophils Relative: 1.4 %
HCT: 38 % (ref 35.0–45.0)
Hemoglobin: 12.4 g/dL (ref 11.7–15.5)
Lymphs Abs: 2234 cells/uL (ref 850–3900)
MCH: 26 pg — ABNORMAL LOW (ref 27.0–33.0)
MCHC: 32.6 g/dL (ref 32.0–36.0)
MCV: 79.7 fL — ABNORMAL LOW (ref 80.0–100.0)
MPV: 9.4 fL (ref 7.5–12.5)
Monocytes Relative: 8 %
Neutro Abs: 2811 cells/uL (ref 1500–7800)
Neutrophils Relative %: 50.2 %
Platelets: 363 10*3/uL (ref 140–400)
RBC: 4.77 10*6/uL (ref 3.80–5.10)
RDW: 13.1 % (ref 11.0–15.0)
Total Lymphocyte: 39.9 %
WBC: 5.6 10*3/uL (ref 3.8–10.8)

## 2022-05-02 LAB — COMPLETE METABOLIC PANEL WITH GFR
AG Ratio: 1.5 (calc) (ref 1.0–2.5)
ALT: 17 U/L (ref 6–29)
AST: 16 U/L (ref 10–30)
Albumin: 4.8 g/dL (ref 3.6–5.1)
Alkaline phosphatase (APISO): 52 U/L (ref 31–125)
BUN: 14 mg/dL (ref 7–25)
CO2: 26 mmol/L (ref 20–32)
Calcium: 9.6 mg/dL (ref 8.6–10.2)
Chloride: 104 mmol/L (ref 98–110)
Creat: 0.76 mg/dL (ref 0.50–0.97)
Globulin: 3.2 g/dL (calc) (ref 1.9–3.7)
Glucose, Bld: 65 mg/dL (ref 65–99)
Potassium: 4.2 mmol/L (ref 3.5–5.3)
Sodium: 140 mmol/L (ref 135–146)
Total Bilirubin: 1 mg/dL (ref 0.2–1.2)
Total Protein: 8 g/dL (ref 6.1–8.1)
eGFR: 107 mL/min/{1.73_m2} (ref >=60)

## 2022-05-02 LAB — TSH: TSH: 1.65 mIU/L

## 2022-05-02 LAB — LIPID PANEL
Cholesterol: 209 mg/dL — ABNORMAL HIGH (ref <200)
HDL: 81 mg/dL (ref >=50)
LDL Cholesterol (Calc): 112 mg/dL (calc) — ABNORMAL HIGH
Non-HDL Cholesterol (Calc): 128 mg/dL (calc) (ref <130)
Total CHOL/HDL Ratio: 2.6 (calc) (ref <5.0)
Triglycerides: 72 mg/dL (ref <150)

## 2022-05-08 ENCOUNTER — Other Ambulatory Visit: Payer: Self-pay | Admitting: Medical-Surgical

## 2022-05-08 DIAGNOSIS — Z8619 Personal history of other infectious and parasitic diseases: Secondary | ICD-10-CM

## 2022-05-08 MED ORDER — VALACYCLOVIR HCL 1 G PO TABS: 1000.0000 mg | ORAL_TABLET | Freq: Every day | ORAL | 0 refills | Status: DC

## 2022-05-10 ENCOUNTER — Encounter: Payer: Self-pay | Admitting: Medical-Surgical

## 2022-05-10 DIAGNOSIS — B3731 Acute candidiasis of vulva and vagina: Secondary | ICD-10-CM

## 2022-05-11 MED ORDER — FLUCONAZOLE 150 MG PO TABS: 150.0000 mg | ORAL_TABLET | ORAL | 0 refills | Status: DC

## 2022-05-28 ENCOUNTER — Encounter: Payer: Self-pay | Admitting: Medical-Surgical

## 2022-05-28 ENCOUNTER — Telehealth (INDEPENDENT_AMBULATORY_CARE_PROVIDER_SITE_OTHER): Payer: 59 | Admitting: Medical-Surgical

## 2022-05-28 DIAGNOSIS — F902 Attention-deficit hyperactivity disorder, combined type: Secondary | ICD-10-CM

## 2022-05-28 DIAGNOSIS — R69 Illness, unspecified: Secondary | ICD-10-CM | POA: Diagnosis not present

## 2022-05-28 MED ORDER — AMPHETAMINE-DEXTROAMPHET ER 30 MG PO CP24: 30.0000 mg | ORAL_CAPSULE | ORAL | 0 refills | Status: DC

## 2022-05-28 NOTE — Progress Notes (Signed)
Virtual Visit via Video Note  I connected with Theresa Wheeler on 05/28/22 at 10:50 AM EST by a video enabled telemedicine application and verified that I am speaking with the correct person using two identifiers.   I discussed the limitations of evaluation and management by telemedicine and the availability of in person appointments. The patient expressed understanding and agreed to proceed.  Patient location: home Provider locations: office  Subjective:    CC: ADHD follow up  HPI: Pleasant 32 year old female presenting today for follow-up on ADHD.  Approximately 1 month ago, we started Adderall XR 10 mg daily.  She took this regularly for about 2 weeks and over the last few days increased it on her own to 20 mg daily.  She is tolerating the medication well without side effects.  No changes in appetite, sleep pattern, or weight.  Denies palpitations, worsened anxiety, chest pain, or shortness of breath.  Reports that the medication seems to be wearing off too early in the day.  She takes her morning dose between 9-11 AM and her medication seems to wear off between 2-4 PM.  Initially increased the dose to 20 mg on her own to see if a larger dose would provide more effectiveness later in the day.  Unfortunately this has not been the case.  She is terribly forgetful and is not sure she would remember an afternoon dose so would like to look into increasing the morning dose to see if that provides a longer period of effectiveness.  Past medical history, Surgical history, Family history not pertinant except as noted below, Social history, Allergies, and medications have been entered into the medical record, reviewed, and corrections made.   Review of Systems: See HPI for pertinent positives and negatives.   Objective:    General: Speaking clearly in complete sentences without any shortness of breath.  Alert and oriented x3.  Normal judgment. No apparent acute distress.  Impression and  Recommendations:    1. ADHD (attention deficit hyperactivity disorder), combined type Ultimately I think she would get better effectiveness out of adding an afternoon instant release dose of Adderall however she would like to try the increased dose in the morning.  Increasing Adderall XR to 30 mg daily.  Monitor for signs of intolerance or concerning side effects.  We will touch base in 2 to 3 weeks via MyChart message to see if this has been effective or if we need to switch it around and had the instant release in the afternoon on an as-needed basis.  I discussed the assessment and treatment plan with the patient. The patient was provided an opportunity to ask questions and all were answered. The patient agreed with the plan and demonstrated an understanding of the instructions.   The patient was advised to call back or seek an in-person evaluation if the symptoms worsen or if the condition fails to improve as anticipated.  25 minutes of non-face-to-face time was provided during this encounter.  Return in about 3 months (around 08/27/2022) for ADHD follow up.  Clearnce Sorrel, DNP, APRN, FNP-BC [facility]

## 2022-06-08 ENCOUNTER — Other Ambulatory Visit: Payer: Self-pay | Admitting: Medical-Surgical

## 2022-06-08 DIAGNOSIS — Z8619 Personal history of other infectious and parasitic diseases: Secondary | ICD-10-CM

## 2022-07-02 ENCOUNTER — Encounter: Payer: Self-pay | Admitting: Medical-Surgical

## 2022-07-02 DIAGNOSIS — Z8619 Personal history of other infectious and parasitic diseases: Secondary | ICD-10-CM

## 2022-07-07 MED ORDER — VALACYCLOVIR HCL 1 G PO TABS: 1000.0000 mg | ORAL_TABLET | Freq: Every day | ORAL | 1 refills | Status: DC

## 2022-07-15 ENCOUNTER — Encounter: Payer: Self-pay | Admitting: Medical-Surgical

## 2022-07-17 MED ORDER — LISDEXAMFETAMINE DIMESYLATE 50 MG PO CAPS: 50.0000 mg | ORAL_CAPSULE | Freq: Every day | ORAL | 0 refills | Status: DC

## 2022-07-22 MED ORDER — AMPHETAMINE-DEXTROAMPHETAMINE 10 MG PO TABS: 10.0000 mg | ORAL_TABLET | Freq: Every day | ORAL | 0 refills | Status: DC

## 2022-07-22 MED ORDER — AMPHETAMINE-DEXTROAMPHET ER 30 MG PO CP24: 30.0000 mg | ORAL_CAPSULE | ORAL | 0 refills | Status: DC

## 2022-07-22 NOTE — Addendum Note (Signed)
Addended bySamuel Bouche on: 07/22/2022 02:30 PM   Modules accepted: Orders

## 2022-08-20 ENCOUNTER — Other Ambulatory Visit: Payer: Self-pay

## 2022-08-20 MED ORDER — AMPHETAMINE-DEXTROAMPHET ER 30 MG PO CP24: 30.0000 mg | ORAL_CAPSULE | ORAL | 0 refills | Status: DC

## 2022-08-20 MED ORDER — AMPHETAMINE-DEXTROAMPHETAMINE 10 MG PO TABS: 10.0000 mg | ORAL_TABLET | Freq: Every day | ORAL | 0 refills | Status: DC

## 2022-08-20 NOTE — Telephone Encounter (Signed)
Patient due for follow-up appointment in a couple of weeks.  Please contact her to facilitate scheduling this to prevent delays in medication refills.

## 2022-08-20 NOTE — Telephone Encounter (Signed)
Patient sent Mychart message-  Reqeusting rx rf of  Adderall 10mg   - last written 07/22/2022 Adderall XR 30mg  - last written 07/22/2022 Last OV 05/28/2022 No upcoming schld appt.

## 2022-08-24 NOTE — Telephone Encounter (Signed)
Called patient, LVM to call office to schedule appointment concerning medication refills, thanks.

## 2022-09-15 ENCOUNTER — Ambulatory Visit: Payer: Self-pay | Admitting: Nurse Practitioner

## 2022-09-24 ENCOUNTER — Encounter: Payer: Self-pay | Admitting: Medical-Surgical

## 2022-09-24 ENCOUNTER — Telehealth (INDEPENDENT_AMBULATORY_CARE_PROVIDER_SITE_OTHER): Payer: BLUE CROSS/BLUE SHIELD | Admitting: Medical-Surgical

## 2022-09-24 VITALS — Ht 63.0 in | Wt 167.0 lb

## 2022-09-24 DIAGNOSIS — Z7689 Persons encountering health services in other specified circumstances: Secondary | ICD-10-CM

## 2022-09-24 DIAGNOSIS — F902 Attention-deficit hyperactivity disorder, combined type: Secondary | ICD-10-CM | POA: Diagnosis not present

## 2022-09-24 MED ORDER — AMPHETAMINE-DEXTROAMPHET ER 30 MG PO CP24: 30.0000 mg | ORAL_CAPSULE | ORAL | 0 refills | Status: DC

## 2022-09-24 MED ORDER — AMPHETAMINE-DEXTROAMPHETAMINE 10 MG PO TABS: 10.0000 mg | ORAL_TABLET | Freq: Every day | ORAL | 0 refills | Status: DC

## 2022-09-24 MED ORDER — WEGOVY 0.25 MG/0.5ML ~~LOC~~ SOAJ: 0.2500 mg | SUBCUTANEOUS | 0 refills | Status: DC

## 2022-09-24 NOTE — Progress Notes (Signed)
Virtual Visit via Video Note  I connected with Theresa Wheeler on 09/24/22 at  1:00 PM EDT by a video enabled telemedicine application and verified that I am speaking with the correct person using two identifiers.   I discussed the limitations of evaluation and management by telemedicine and the availability of in person appointments. The patient expressed understanding and agreed to proceed.  Patient location: home Provider locations: office  Subjective:    CC: ADHD follow up  HPI: Pleasant 32 year old female presenting via MyChart video visit for the following:  ADHD: Taking Adderall XR 30mg  daily in the morning with a 10mg  instant release dose in the afternoon. Previously had withdrawal symptoms from the XR of ravenous hunger and headache but the addition of the IR dose has solved these symptoms. Feels that the medication is working well for her and she is able to focus well. Has change jobs and is in a lower stress environment. No change in sleep pattern, weight, or appetite. No palpitations, worsened anxiety, chest pain, or constipation.   Weight: continues to have concerns about her weight and is interested in options to help with weight management. Has been exercising with cardio and strength training activities. Following a healthy diet and practicing portion control. Very interested in [place] as she tried this before and it seemed to work well.    Past medical history, Surgical history, Family history not pertinant except as noted below, Social history, Allergies, and medications have been entered into the medical record, reviewed, and corrections made.   Review of Systems: See HPI for pertinent positives and negatives.   Objective:    General: Speaking clearly in complete sentences without any shortness of breath.  Alert and oriented x3.  Normal judgment. No apparent acute distress.  Impression and Recommendations:    1. ADHD (attention deficit hyperactivity disorder),  combined type Symptoms well managed on current regimen. Continue Adderall XR 30mg  daily with Adderall 10mg  every afternoon.  - amphetamine-dextroamphetamine (ADDERALL XR) 30 MG 24 hr capsule; Take 1 capsule (30 mg total) by mouth every morning.  Dispense: 30 capsule; Refill: 0 - amphetamine-dextroamphetamine (ADDERALL) 10 MG tablet; Take 1 tablet (10 mg total) by mouth daily at 2 PM.  Dispense: 30 tablet; Refill: 0 - amphetamine-dextroamphetamine (ADDERALL) 10 MG tablet; Take 1 tablet (10 mg total) by mouth daily at 2 PM.  Dispense: 30 tablet; Refill: 0 - amphetamine-dextroamphetamine (ADDERALL) 10 MG tablet; Take 1 tablet (10 mg total) by mouth daily at 2 PM.  Dispense: 30 tablet; Refill: 0 - amphetamine-dextroamphetamine (ADDERALL XR) 30 MG 24 hr capsule; Take 1 capsule (30 mg total) by mouth every morning.  Dispense: 30 capsule; Refill: 0 - amphetamine-dextroamphetamine (ADDERALL XR) 30 MG 24 hr capsule; Take 1 capsule (30 mg total) by mouth every morning.  Dispense: 30 capsule; Refill: 0  2. Encounter for weight management Discussed restrictions on insurance coverage which will limit options. Reviewed oral medications. Not a candidate for phentermine or Qsymia due to Adderall use. Did not tolerate Wellbutrin in the past. Already practicing healthy behaviors. Sending [phone] 0.25mg  weekly to see if we can get this covered.   I discussed the assessment and treatment plan with the patient. The patient was provided an opportunity to ask questions and all were answered. The patient agreed with the plan and demonstrated an understanding of the instructions.   The patient was advised to call back or seek an in-person evaluation if the symptoms worsen or if the condition fails to improve as anticipated.  25 minutes of non-face-to-face time was provided during this encounter.  Return in about 3 months (around 12/25/2022) for ADHD follow up.  Thayer Ohm, DNP, APRN, FNP-BC [facility]  [facility]

## 2022-12-15 ENCOUNTER — Encounter: Payer: Self-pay | Admitting: Medical-Surgical

## 2022-12-15 DIAGNOSIS — R110 Nausea: Secondary | ICD-10-CM

## 2022-12-16 MED ORDER — PROMETHAZINE HCL 25 MG PO TABS: 25.0000 mg | ORAL_TABLET | Freq: Four times a day (QID) | ORAL | 2 refills | Status: DC | PRN

## 2022-12-16 MED ORDER — ONDANSETRON 8 MG PO TBDP: 8.0000 mg | ORAL_TABLET | Freq: Three times a day (TID) | ORAL | 2 refills | Status: DC | PRN

## 2022-12-18 ENCOUNTER — Other Ambulatory Visit: Payer: Self-pay | Admitting: Medical-Surgical

## 2022-12-18 DIAGNOSIS — Z8619 Personal history of other infectious and parasitic diseases: Secondary | ICD-10-CM

## 2022-12-24 DIAGNOSIS — F411 Generalized anxiety disorder: Secondary | ICD-10-CM | POA: Diagnosis not present

## 2023-01-06 ENCOUNTER — Telehealth (INDEPENDENT_AMBULATORY_CARE_PROVIDER_SITE_OTHER): Payer: BLUE CROSS/BLUE SHIELD | Admitting: Medical-Surgical

## 2023-01-06 ENCOUNTER — Encounter: Payer: Self-pay | Admitting: Medical-Surgical

## 2023-01-06 DIAGNOSIS — Z8619 Personal history of other infectious and parasitic diseases: Secondary | ICD-10-CM | POA: Diagnosis not present

## 2023-01-06 DIAGNOSIS — F902 Attention-deficit hyperactivity disorder, combined type: Secondary | ICD-10-CM

## 2023-01-06 DIAGNOSIS — R110 Nausea: Secondary | ICD-10-CM | POA: Diagnosis not present

## 2023-01-06 MED ORDER — AMPHETAMINE-DEXTROAMPHET ER 30 MG PO CP24
30.0000 mg | ORAL_CAPSULE | ORAL | 0 refills | Status: DC
Start: 2023-02-05 — End: 2023-03-30

## 2023-01-06 MED ORDER — AMPHETAMINE-DEXTROAMPHET ER 30 MG PO CP24
30.0000 mg | ORAL_CAPSULE | ORAL | 0 refills | Status: DC
Start: 2023-01-06 — End: 2023-03-30

## 2023-01-06 MED ORDER — AMPHETAMINE-DEXTROAMPHETAMINE 10 MG PO TABS
10.0000 mg | ORAL_TABLET | Freq: Every day | ORAL | 0 refills | Status: DC
Start: 2023-02-05 — End: 2023-03-30

## 2023-01-06 MED ORDER — AMPHETAMINE-DEXTROAMPHETAMINE 10 MG PO TABS
10.0000 mg | ORAL_TABLET | Freq: Every day | ORAL | 0 refills | Status: DC
Start: 2023-03-07 — End: 2023-03-30

## 2023-01-06 MED ORDER — PROMETHAZINE HCL 25 MG PO TABS
25.0000 mg | ORAL_TABLET | Freq: Four times a day (QID) | ORAL | 2 refills | Status: DC | PRN
Start: 2023-01-06 — End: 2023-11-15

## 2023-01-06 MED ORDER — AMPHETAMINE-DEXTROAMPHET ER 30 MG PO CP24
30.0000 mg | ORAL_CAPSULE | ORAL | 0 refills | Status: DC
Start: 2023-03-07 — End: 2023-03-30

## 2023-01-06 MED ORDER — ONDANSETRON 8 MG PO TBDP: 8.0000 mg | ORAL_TABLET | Freq: Three times a day (TID) | ORAL | 2 refills | Status: DC | PRN

## 2023-01-06 MED ORDER — AMPHETAMINE-DEXTROAMPHETAMINE 10 MG PO TABS
10.0000 mg | ORAL_TABLET | Freq: Every day | ORAL | 0 refills | Status: DC
Start: 2023-01-06 — End: 2023-03-30

## 2023-01-06 MED ORDER — VALACYCLOVIR HCL 1 G PO TABS
1000.0000 mg | ORAL_TABLET | Freq: Every day | ORAL | 3 refills | Status: DC
Start: 2023-01-06 — End: 2023-01-06

## 2023-01-06 MED ORDER — VALACYCLOVIR HCL 1 G PO TABS
1000.0000 mg | ORAL_TABLET | Freq: Every day | ORAL | 3 refills | Status: DC
Start: 2023-01-06 — End: 2023-03-31

## 2023-01-06 NOTE — Progress Notes (Signed)
Virtual Visit via Video Note  I connected with Theresa Wheeler on 01/06/23 at 11:10 AM EDT by a video enabled telemedicine application and verified that I am speaking with the correct person using two identifiers.   I discussed the limitations of evaluation and management by telemedicine and the availability of in person appointments. The patient expressed understanding and agreed to proceed.  Patient location: home Provider locations: office  Subjective:    CC: Medication refills   HPI: Pleasant 32 year old female presenting via MyChart video visit for the following:  ADHD: Taking Adderall XR 30 mg daily, tolerating well without side effects.  Usually takes this dose around 1030-11 AM.  Feels this works well for her in the mornings however she begins to feel the afternoon slump between 2 to 3 PM every day.  Has Adderall 10 mg instant release that she takes around 4 PM but does not feel this is as effective as it used to be.  Has secured a new job that she will be able to work from home and her hours will be changing to 2 PM-10 PM.  She is worried that the effectiveness of the Adderall will be wearing off before the end of her workday and will be difficult to manage.  Denies palpitations, chest pain, shortness of breath, constipation, difficulty sleeping, appetite fluctuations, and weight changes.  Nausea: She is currently taking compounded semaglutide for weight loss purposes.  She usually has some intermittent nausea after the first 3 days of the injections.  Has Phenergan and Zofran that she uses as needed for this.  Requesting refills.  HSV: Taking Valtrex 1000 mg daily for suppressive therapy.  Feels the medication works very well and has not had no recent outbreaks.   Past medical history, Surgical history, Family history not pertinant except as noted below, Social history, Allergies, and medications have been entered into the medical record, reviewed, and corrections made.   Review  of Systems: See HPI for pertinent positives and negatives.   Objective:    General: Speaking clearly in complete sentences without any shortness of breath.  Alert and oriented x3.  Normal judgment. No apparent acute distress.  Impression and Recommendations:    1. ADHD (attention deficit hyperactivity disorder), combined type Discussed recommendations for dosing modification with her new job and new hours.  Continue Adderall XR 30 mg in the morning.  Since she is experiencing the afternoon slump, recommend increasing instant release Adderall to 10-20 mg in the evening.  Okay to separate this into to 10 mg doses as long as this does not interfere with sleep.  Plan for her to touch base with me in approximately 3-4 weeks to let me know how this regimen is working or if we need to make further changes. - amphetamine-dextroamphetamine (ADDERALL XR) 30 MG 24 hr capsule; Take 1 capsule (30 mg total) by mouth every morning.  Dispense: 30 capsule; Refill: 0 - amphetamine-dextroamphetamine (ADDERALL XR) 30 MG 24 hr capsule; Take 1 capsule (30 mg total) by mouth every morning.  Dispense: 30 capsule; Refill: 0 - amphetamine-dextroamphetamine (ADDERALL XR) 30 MG 24 hr capsule; Take 1 capsule (30 mg total) by mouth every morning.  Dispense: 30 capsule; Refill: 0 - amphetamine-dextroamphetamine (ADDERALL) 10 MG tablet; Take 1-2 tablets (10-20 mg total) by mouth daily at 2 PM.  Dispense: 60 tablet; Refill: 0 - amphetamine-dextroamphetamine (ADDERALL) 10 MG tablet; Take 1-2 tablets (10-20 mg total) by mouth daily at 2 PM.  Dispense: 60 tablet; Refill: 0 - amphetamine-dextroamphetamine (  ADDERALL) 10 MG tablet; Take 1-2 tablets (10-20 mg total) by mouth daily at 2 PM.  Dispense: 60 tablet; Refill: 0  2. Nausea Refilling antiemetics. - promethazine (PHENERGAN) 25 MG tablet; Take 1 tablet (25 mg total) by mouth every 6 (six) hours as needed for nausea or vomiting.  Dispense: 20 tablet; Refill: 2  3. History of PCR  DNA positive for HSV1 Continue Valtrex 1000 mg daily. - valACYclovir (VALTREX) 1000 MG tablet; Take 1 tablet (1,000 mg total) by mouth daily. Dispense: 90 tablet; Refill: 3   I discussed the assessment and treatment plan with the patient. The patient was provided an opportunity to ask questions and all were answered. The patient agreed with the plan and demonstrated an understanding of the instructions.   The patient was advised to call back or seek an in-person evaluation if the symptoms worsen or if the condition fails to improve as anticipated.  25 minutes of non-face-to-face time was provided during this encounter.  Return in about 3 months (around 04/08/2023) for annual physical exam or sooner if needed.  Thayer Ohm, DNP, APRN, FNP-BC [facility] and Sports Medicine

## 2023-01-07 DIAGNOSIS — F411 Generalized anxiety disorder: Secondary | ICD-10-CM | POA: Diagnosis not present

## 2023-01-13 ENCOUNTER — Ambulatory Visit: Payer: Self-pay | Admitting: Nurse Practitioner

## 2023-01-15 DIAGNOSIS — F411 Generalized anxiety disorder: Secondary | ICD-10-CM | POA: Diagnosis not present

## 2023-02-04 DIAGNOSIS — F411 Generalized anxiety disorder: Secondary | ICD-10-CM | POA: Diagnosis not present

## 2023-03-24 ENCOUNTER — Other Ambulatory Visit: Payer: Self-pay | Admitting: Medical-Surgical

## 2023-03-24 DIAGNOSIS — Z8619 Personal history of other infectious and parasitic diseases: Secondary | ICD-10-CM

## 2023-03-30 ENCOUNTER — Ambulatory Visit: Payer: Managed Care, Other (non HMO) | Admitting: Medical-Surgical

## 2023-03-30 ENCOUNTER — Encounter: Payer: Self-pay | Admitting: Medical-Surgical

## 2023-03-30 VITALS — BP 111/73 | HR 72 | Resp 20 | Ht 63.0 in | Wt 150.1 lb

## 2023-03-30 DIAGNOSIS — N939 Abnormal uterine and vaginal bleeding, unspecified: Secondary | ICD-10-CM | POA: Diagnosis not present

## 2023-03-30 DIAGNOSIS — F902 Attention-deficit hyperactivity disorder, combined type: Secondary | ICD-10-CM

## 2023-03-30 DIAGNOSIS — R110 Nausea: Secondary | ICD-10-CM | POA: Diagnosis not present

## 2023-03-30 DIAGNOSIS — Z202 Contact with and (suspected) exposure to infections with a predominantly sexual mode of transmission: Secondary | ICD-10-CM | POA: Diagnosis not present

## 2023-03-30 DIAGNOSIS — Z8619 Personal history of other infectious and parasitic diseases: Secondary | ICD-10-CM

## 2023-03-30 NOTE — Progress Notes (Unsigned)
        Established patient visit  History, exam, impression, and plan:  Primary female hypogonadism Up-to-date testosterone labs.  Due for his next testosterone injection.  Doing well on his current regimen with stable vitals.  Testosterone cypionate 100 mg IM given x 1 in the office.  Due for next shot in 2 weeks.  Lump of skin of lower extremity, left Noted a lump on the left posterior calf approximately 1 month ago.  Initially, had redness, tenderness, swelling at the area however this has fully resolved.  No longer has any residual symptoms but the lump has remained.  His wife noticed it and urged him to be seen to evaluate it.  History notable for varicose veins, compliant with recommendations for compression socks daily.  No recent injury or trauma to the area.  Unclear etiology.  The lump is approximately 1 cm x 1.5 cm and slightly discolored.  See clinical photo.  Plan to get vascular ultrasound as it does seem to be connected to one of the varicose veins that runs through the area.  Suspect superficial thrombophlebitis initially.  Discussed conservative measures with Tylenol, heat, ice, and compression socks.    Procedures performed this visit: None.  Return in about 2 weeks (around 09/02/2022) for nurse visit for testosterone shot.  __________________________________ Joy L. Jessup, DNP, APRN, FNP-BC Primary Care and Sports Medicine [facility] [place]  

## 2023-03-31 MED ORDER — AMPHETAMINE-DEXTROAMPHETAMINE 10 MG PO TABS: 10.0000 mg | ORAL_TABLET | Freq: Every day | ORAL | 0 refills | Status: DC

## 2023-03-31 MED ORDER — VALACYCLOVIR HCL 1 G PO TABS
1000.0000 mg | ORAL_TABLET | Freq: Every day | ORAL | 3 refills | Status: DC
Start: 2023-03-31 — End: 2024-04-03

## 2023-03-31 MED ORDER — ONDANSETRON 8 MG PO TBDP: 8.0000 mg | ORAL_TABLET | Freq: Three times a day (TID) | ORAL | 2 refills | Status: DC | PRN

## 2023-03-31 MED ORDER — AMPHETAMINE-DEXTROAMPHET ER 30 MG PO CP24: 30.0000 mg | ORAL_CAPSULE | ORAL | 0 refills | Status: DC

## 2023-03-31 MED ORDER — DOXYCYCLINE HYCLATE 100 MG PO TABS: 100.0000 mg | ORAL_TABLET | Freq: Two times a day (BID) | ORAL | 0 refills | Status: DC

## 2023-03-31 MED ORDER — AMPHETAMINE-DEXTROAMPHET ER 30 MG PO CP24
30.0000 mg | ORAL_CAPSULE | ORAL | 0 refills | Status: DC
Start: 2023-03-31 — End: 2023-11-24

## 2023-04-01 ENCOUNTER — Ambulatory Visit: Payer: Managed Care, Other (non HMO)

## 2023-04-01 ENCOUNTER — Ambulatory Visit (INDEPENDENT_AMBULATORY_CARE_PROVIDER_SITE_OTHER): Payer: Managed Care, Other (non HMO) | Admitting: Medical-Surgical

## 2023-04-01 VITALS — Temp 98.7°F | Ht 63.0 in | Wt 147.0 lb

## 2023-04-01 DIAGNOSIS — Z202 Contact with and (suspected) exposure to infections with a predominantly sexual mode of transmission: Secondary | ICD-10-CM

## 2023-04-01 DIAGNOSIS — R1030 Lower abdominal pain, unspecified: Secondary | ICD-10-CM

## 2023-04-01 DIAGNOSIS — N939 Abnormal uterine and vaginal bleeding, unspecified: Secondary | ICD-10-CM

## 2023-04-01 MED ORDER — CEFTRIAXONE SODIUM 1 G IJ SOLR
1.0000 g | Freq: Once | INTRAMUSCULAR | Status: AC
  Administered 2023-04-01: 1 g via INTRAMUSCULAR

## 2023-04-01 NOTE — Progress Notes (Signed)
Patient is here for a Rocephin 1 g injection  Patient tolerated injection well without complications on the LUOQ.

## 2023-04-04 LAB — IRON,TIBC AND FERRITIN PANEL
Ferritin: 22 ng/mL (ref 15–150)
Iron Saturation: 20 % (ref 15–55)
Iron: 79 ug/dL (ref 27–159)
Total Iron Binding Capacity: 394 ug/dL (ref 250–450)
UIBC: 315 ug/dL (ref 131–425)

## 2023-04-04 LAB — CBC WITH DIFFERENTIAL/PLATELET
Basophils Absolute: 0 10*3/uL (ref 0.0–0.2)
Basos: 1 %
EOS (ABSOLUTE): 0.2 10*3/uL (ref 0.0–0.4)
Eos: 3 %
Hematocrit: 36.2 % (ref 34.0–46.6)
Hemoglobin: 11.5 g/dL (ref 11.1–15.9)
Immature Grans (Abs): 0 10*3/uL (ref 0.0–0.1)
Immature Granulocytes: 0 %
Lymphocytes Absolute: 2.4 10*3/uL (ref 0.7–3.1)
Lymphs: 47 %
MCH: 26.8 pg (ref 26.6–33.0)
MCHC: 31.8 g/dL (ref 31.5–35.7)
MCV: 84 fL (ref 79–97)
Monocytes Absolute: 0.4 10*3/uL (ref 0.1–0.9)
Monocytes: 8 %
Neutrophils Absolute: 2.1 10*3/uL (ref 1.4–7.0)
Neutrophils: 41 %
Platelets: 375 10*3/uL (ref 150–450)
RBC: 4.29 x10E6/uL (ref 3.77–5.28)
RDW: 14.1 % (ref 11.7–15.4)
WBC: 5.2 10*3/uL (ref 3.4–10.8)

## 2023-04-04 LAB — CT/GC/TV NAA+MYCOPLASMAS URINE
Chlamydia trachomatis, NAA: POSITIVE — AB
Mycoplasma genitalium NAA: NEGATIVE
Mycoplasma hominis NAA: POSITIVE — AB
Neisseria gonorrhoeae, NAA: NEGATIVE
Trich vag by NAA: NEGATIVE
Ureaplasma spp NAA: POSITIVE — AB

## 2023-04-05 ENCOUNTER — Encounter: Payer: Self-pay | Admitting: Medical-Surgical

## 2023-04-05 NOTE — Telephone Encounter (Signed)
See other MyChart message

## 2023-04-06 MED ORDER — FLUCONAZOLE 150 MG PO TABS: 150.0000 mg | ORAL_TABLET | Freq: Once | ORAL | 1 refills | Status: AC

## 2023-04-06 MED ORDER — DOXYCYCLINE HYCLATE 100 MG PO TABS: 100.0000 mg | ORAL_TABLET | Freq: Two times a day (BID) | ORAL | 0 refills | Status: DC

## 2023-04-06 MED ORDER — AZITHROMYCIN 500 MG PO TABS: ORAL_TABLET | ORAL | 0 refills | Status: AC

## 2023-04-06 NOTE — Addendum Note (Signed)
Addended byChristen Butter on: 04/06/2023 09:18 AM   Modules accepted: Orders

## 2023-04-08 ENCOUNTER — Ambulatory Visit: Payer: Self-pay | Admitting: Radiology

## 2023-05-13 ENCOUNTER — Ambulatory Visit: Payer: Self-pay | Admitting: Radiology

## 2023-10-06 ENCOUNTER — Other Ambulatory Visit: Payer: Self-pay | Admitting: Medical-Surgical

## 2023-11-14 NOTE — Progress Notes (Unsigned)
 Virtual Visit via Video Note  I connected with Cathlean FORBES Clan on 11/16/23 at  3:20 PM EDT by a video enabled telemedicine application and verified that I am speaking with the correct person using two identifiers.   I discussed the limitations of evaluation and management by telemedicine and the availability of in person appointments. The patient expressed understanding and agreed to proceed.  Patient location: home Provider locations: office  Subjective:    CC: trouble sleeping, acne, procedural anxiety  HPI: Very pleasant 33 year old female presenting today to discuss difficulty sleeping.  She has a long history of insomnia and has tried multiple over-the-counter options.  Most of these options were either ineffective or required her to continue increasing the dose to maintain efficacy.  She is now at a point that the difficulty sleeping is affecting her daily activities and would like to consider prescription options.  She would like to avoid any controlled medications and anything that is potentially addictive.  Has a friend that mentioned hydroxyzine and is interested in trying that.  Has had a battle with facial acne for many years.  Saw dermatology who prescribed tretinoin and clindamycin  which has worked very well for her.  Would like to switch management of these medications over to primary care.  Reports that she has plans to have an IUD inserted by her OB/GYN.  She has had a very bad experience in the past with an IUD and is extremely anxious regarding the procedure and the aftereffects.  She discussed this with her OB/GYN who recommended she speak to her PCP about preprocedural anxiolysis.   Past medical history, Surgical history, Family history not pertinant except as noted below, Social history, Allergies, and medications have been entered into the medical record, reviewed, and corrections made.   Review of Systems: See HPI for pertinent positives and negatives.   Objective:     General: Speaking clearly in complete sentences without any shortness of breath.  Alert and oriented x3.  Normal judgment. No apparent acute distress.  Impression and Recommendations:    1. Mixed insomnia (Primary) Very long history of mixed insomnia with difficulty falling asleep and maintaining sleep.  Discussed various options that are not controlled or addictive.  We will trial hydroxyzine 25-50 mg nightly as needed for sleep.  Reviewed maximum one-time dose of 100 mg at bedtime if the 50 mg dose is not effective.  She will give this a fair trial and let me know how things go via MyChart message.  If this is not effective, we could certainly consider trazodone, low-dose doxepin, or gabapentin.  2. Acne vulgaris Currently doing well on her prescribed regimen.  I will be happy to manage these medications here in primary care.  Refilling tretinoin and clindamycin  today. - tretinoin (RETIN-A) 0.025 % cream; Apply topically at bedtime.  Dispense: 45 g; Refill: 5 - clindamycin  (CLEOCIN -T) 1 % lotion; Apply topically at bedtime.  Dispense: 60 mL; Refill: 5  3. Anxiety due to invasive procedure Discussed options for preprocedural anxiolysis.  Given her level of anxiety regarding the procedure, agreed to send in triazolam .  Okay to take 1 tablet the night before at bedtime then 1-2 tablets 1-2 hours prior to the IUD procedure.  She is quite concerned about the cramping and pain that she experienced previously.  This was not responsive to Tylenol  and ibuprofen in the past so agreed to send in a few tabs of Percocet to take 1 every 8 hours as needed.  Advised to avoid  taking triazolam  along with narcotics and she verbalized understanding and is agreeable to the plan.  I discussed the assessment and treatment plan with the patient. The patient was provided an opportunity to ask questions and all were answered. The patient agreed with the plan and demonstrated an understanding of the instructions.   The  patient was advised to call back or seek an in-person evaluation if the symptoms worsen or if the condition fails to improve as anticipated.  Return if symptoms worsen or fail to improve.  Zada FREDRIK Palin, DNP, APRN, FNP-BC [facility] and Sports Medicine

## 2023-11-15 ENCOUNTER — Telehealth (INDEPENDENT_AMBULATORY_CARE_PROVIDER_SITE_OTHER): Admitting: Medical-Surgical

## 2023-11-15 ENCOUNTER — Encounter: Payer: Self-pay | Admitting: Medical-Surgical

## 2023-11-15 DIAGNOSIS — F419 Anxiety disorder, unspecified: Secondary | ICD-10-CM

## 2023-11-15 DIAGNOSIS — L700 Acne vulgaris: Secondary | ICD-10-CM | POA: Diagnosis not present

## 2023-11-15 DIAGNOSIS — G4709 Other insomnia: Secondary | ICD-10-CM | POA: Diagnosis not present

## 2023-11-15 DIAGNOSIS — G4700 Insomnia, unspecified: Secondary | ICD-10-CM

## 2023-11-15 DIAGNOSIS — Z1331 Encounter for screening for depression: Secondary | ICD-10-CM

## 2023-11-15 MED ORDER — CLINDAMYCIN PHOSPHATE 1 % EX LOTN
TOPICAL_LOTION | Freq: Every day | CUTANEOUS | 5 refills | Status: AC
Start: 2023-11-15 — End: ?

## 2023-11-15 MED ORDER — TRETINOIN 0.025 % EX CREA
TOPICAL_CREAM | Freq: Every day | CUTANEOUS | 5 refills | Status: AC
Start: 2023-11-15 — End: ?

## 2023-11-15 MED ORDER — TRIAZOLAM 0.25 MG PO TABS: ORAL_TABLET | ORAL | 0 refills | Status: DC

## 2023-11-15 MED ORDER — OXYCODONE-ACETAMINOPHEN 5-325 MG PO TABS: 1.0000 | ORAL_TABLET | Freq: Three times a day (TID) | ORAL | 0 refills | Status: DC | PRN

## 2023-11-15 MED ORDER — HYDROXYZINE PAMOATE 25 MG PO CAPS: 25.0000 mg | ORAL_CAPSULE | Freq: Every evening | ORAL | 1 refills | Status: DC | PRN

## 2023-11-23 ENCOUNTER — Encounter: Payer: Self-pay | Admitting: Medical-Surgical

## 2023-11-23 DIAGNOSIS — F902 Attention-deficit hyperactivity disorder, combined type: Secondary | ICD-10-CM

## 2023-11-24 MED ORDER — AMPHETAMINE-DEXTROAMPHET ER 30 MG PO CP24: 30.0000 mg | ORAL_CAPSULE | ORAL | 0 refills | Status: DC

## 2023-11-28 ENCOUNTER — Other Ambulatory Visit: Payer: Self-pay | Admitting: Medical-Surgical

## 2023-11-29 ENCOUNTER — Other Ambulatory Visit: Payer: Self-pay | Admitting: Medical-Surgical

## 2023-11-29 MED ORDER — ONDANSETRON 8 MG PO TBDP: 8.0000 mg | ORAL_TABLET | Freq: Three times a day (TID) | ORAL | 3 refills | Status: AC | PRN

## 2024-01-08 ENCOUNTER — Other Ambulatory Visit: Payer: Self-pay | Admitting: Medical-Surgical

## 2024-02-01 ENCOUNTER — Other Ambulatory Visit: Payer: Self-pay | Admitting: Medical-Surgical

## 2024-02-10 ENCOUNTER — Ambulatory Visit: Admitting: Medical-Surgical

## 2024-02-10 NOTE — Progress Notes (Deleted)
        Established patient visit   History of Present Illness   Discussed the use of AI scribe software for clinical note transcription with the patient, who gave verbal consent to proceed.  History of Present Illness           Physical Exam   Physical Exam Assessment & Plan   Problem List Items Addressed This Visit       Other   ADHD (attention deficit hyperactivity disorder), combined type   History of PCR DNA positive for HSV1 - Primary   Assessment and Plan             Follow up   No follow-ups on file. __________________________________ Zada FREDRIK Palin, DNP, APRN, FNP-BC Primary Care and Sports Medicine [facility] [place]

## 2024-02-29 NOTE — Progress Notes (Unsigned)
        Established patient visit   History of Present Illness   Discussed the use of AI scribe software for clinical note transcription with the patient, who gave verbal consent to proceed.  History of Present Illness   Theresa Wheeler is a 33 year old female who presents with a breast lump and medication management concerns.  Breast mass - Lump in right breast at the twelve o'clock position - Increased in size from a dime to a quarter - Mobile, rubbery, and not very hard - Initially changed with menstrual cycle but now has grown independently of cycle  Sleep disturbance and medication management - Difficulty with sleep - Managing sleep issues with hydroxyzine  50 mg nightly for about one month - Hydroxyzine  becoming less effective; feels need to increase dose but has not yet tried 75 mg - Previously stopped Adderall due to negative impact on sleep - Resumed Adderall, now taking it earlier in the morning to reduce sleep disruption    Physical Exam   Physical Exam Vitals reviewed.  Constitutional:      General: She is not in acute distress.    Appearance: Normal appearance. She is not ill-appearing.  HENT:     Head: Normocephalic and atraumatic.  Cardiovascular:     Rate and Rhythm: Normal rate and regular rhythm.  Pulmonary:     Effort: Pulmonary effort is normal. No respiratory distress.  Skin:    General: Skin is warm and dry.  Neurological:     Mental Status: She is alert and oriented to person, place, and time.  Psychiatric:        Mood and Affect: Mood normal.        Behavior: Behavior normal.        Thought Content: Thought content normal.        Judgment: Judgment normal.    Assessment & Plan   Right breast lump Lump at 12 o'clock position, increased in size, mobile, rubbery, concerning for malignancy. - Order diagnostic mammogram with ultrasound at [facility] in [place].  Attention-deficit hyperactivity disorder, combined type ADHD  managed long-term with Adderall XR 30mg  daily and Adderall 10mg  in the afternoon prn, adjusted timing for optimal effect and minimal sleep disruption. - Refilling Adderall prescriptions.  Mixed insomnia Sleep disturbances possibly due to developing hydroxyzine  tolerance. Discussed increasing dosage before considering other medications. - Prescribe hydroxyzine  50 mg tablets, instruct to take 50-100 mg at night as needed.  Encounter for immunization Due for flu vaccination. - Administer flu shot.     Follow up   Return in about 6 months (around 08/30/2024) for ADHD follow up. __________________________________ Zada FREDRIK Palin, DNP, APRN, FNP-BC Primary Care and Sports Medicine [facility] [place]

## 2024-03-01 ENCOUNTER — Ambulatory Visit: Admitting: Medical-Surgical

## 2024-03-01 ENCOUNTER — Encounter: Payer: Self-pay | Admitting: Medical-Surgical

## 2024-03-01 VITALS — BP 110/76 | HR 76 | Temp 98.1°F | Resp 18 | Ht 63.0 in | Wt 140.1 lb

## 2024-03-01 DIAGNOSIS — G4700 Insomnia, unspecified: Secondary | ICD-10-CM | POA: Diagnosis not present

## 2024-03-01 DIAGNOSIS — F902 Attention-deficit hyperactivity disorder, combined type: Secondary | ICD-10-CM

## 2024-03-01 DIAGNOSIS — N6315 Unspecified lump in the right breast, overlapping quadrants: Secondary | ICD-10-CM

## 2024-03-01 DIAGNOSIS — Z23 Encounter for immunization: Secondary | ICD-10-CM

## 2024-03-01 MED ORDER — AMPHETAMINE-DEXTROAMPHETAMINE 10 MG PO TABS
10.0000 mg | ORAL_TABLET | Freq: Every day | ORAL | 0 refills | Status: AC
Start: 2024-03-01 — End: ?

## 2024-03-01 MED ORDER — AMPHETAMINE-DEXTROAMPHET ER 30 MG PO CP24: 30.0000 mg | ORAL_CAPSULE | ORAL | 0 refills | Status: AC

## 2024-03-01 MED ORDER — AMPHETAMINE-DEXTROAMPHETAMINE 10 MG PO TABS: 10.0000 mg | ORAL_TABLET | Freq: Every day | ORAL | 0 refills | Status: AC

## 2024-03-01 MED ORDER — HYDROXYZINE HCL 50 MG PO TABS: 50.0000 mg | ORAL_TABLET | Freq: Every evening | ORAL | 3 refills | Status: AC | PRN

## 2024-03-03 ENCOUNTER — Encounter: Payer: Self-pay | Admitting: Medical-Surgical

## 2024-03-03 ENCOUNTER — Other Ambulatory Visit: Payer: Self-pay

## 2024-03-03 DIAGNOSIS — N6315 Unspecified lump in the right breast, overlapping quadrants: Secondary | ICD-10-CM

## 2024-03-03 NOTE — Telephone Encounter (Signed)
 Orders, clinical notes, demographics and copies of insurance cards have been faxed to [facility] at [phone].  Office will contact you to schedule an appointment. Hope all is well.  [employer]

## 2024-03-06 ENCOUNTER — Other Ambulatory Visit: Payer: Self-pay | Admitting: Medical-Surgical

## 2024-03-06 DIAGNOSIS — N6315 Unspecified lump in the right breast, overlapping quadrants: Secondary | ICD-10-CM

## 2024-03-15 ENCOUNTER — Encounter: Payer: Self-pay | Admitting: Medical-Surgical

## 2024-03-15 ENCOUNTER — Telehealth: Admitting: Medical-Surgical

## 2024-03-15 DIAGNOSIS — F419 Anxiety disorder, unspecified: Secondary | ICD-10-CM

## 2024-03-15 MED ORDER — CLONAZEPAM 0.5 MG PO TABS: 0.5000 mg | ORAL_TABLET | Freq: Two times a day (BID) | ORAL | 0 refills | Status: AC | PRN

## 2024-03-15 NOTE — Progress Notes (Signed)
 Virtual Visit via Video Note  I connected with Theresa Wheeler on 03/15/24 at 10:30 AM EDT by a video enabled telemedicine application and verified that I am speaking with the correct person using two identifiers.   I discussed the limitations of evaluation and management by telemedicine and the availability of in person appointments. The patient expressed understanding and agreed to proceed.  Patient location: home Provider locations: office  Subjective:    CC: Mood concerns  HPI: Pleasant 33 year old female presenting today with a history of intolerance to hormonal birth control.  She has a Mirena IUD in place however feels that it has been contributing to significant emotional issues.  Lately she has been having some severe anxiety/depressive symptoms that are no longer manageable.  After discussion with her OB/GYN regarding this, they have made the decision to remove the IUD on March 22, 2024.  Her OB/GYN recommended she reach out to her PCP regarding a temporary prescription to help with acute anxiety.  She took Xanax  in the past and reports that she did well with it however it did leave her feeling a little high.  She is interested in something that she can use only as needed and only for severe anxiety that does not respond to self soothing methods.  Open to suggestions.  Denies SI/HI.   Past medical history, Surgical history, Family history not pertinant except as noted below, Social history, Allergies, and medications have been entered into the medical record, reviewed, and corrections made.   Review of Systems: See HPI for pertinent positives and negatives.   Objective:    General: Speaking clearly in complete sentences without any shortness of breath.  Alert and oriented x3.  Normal judgment. No apparent acute distress.  Impression and Recommendations:    1. Anxiety (Primary) Reviewed options for management of anxiety.  She is looking for an as needed medication and has  historically been very responsible using controlled substances.  She is aware of the risks of tolerance and dependence and plans to only use the medication on an as needed basis as sparingly as possible.  Discussed options and would like to avoid Xanax  since her symptoms are more pervasive throughout the day rather than acute anxiety attacks intermittently.  Starting clonazepam 0.5 mg twice daily as needed with plans to discontinue after 1 month.  Recommend continuing with plan to remove IUD and allow her body to reset without the additional hormones.  If anxiety/depression symptoms remain severe after the IUD is removed, plan to discuss additional options.  I discussed the assessment and treatment plan with the patient. The patient was provided an opportunity to ask questions and all were answered. The patient agreed with the plan and demonstrated an understanding of the instructions.   The patient was advised to call back or seek an in-person evaluation if the symptoms worsen or if the condition fails to improve as anticipated.  Return if symptoms worsen or fail to improve.  Zada FREDRIK Palin, DNP, APRN, FNP-BC [facility] and Sports Medicine

## 2024-04-01 ENCOUNTER — Other Ambulatory Visit: Payer: Self-pay | Admitting: Medical-Surgical

## 2024-04-01 DIAGNOSIS — Z8619 Personal history of other infectious and parasitic diseases: Secondary | ICD-10-CM

## 2024-04-07 ENCOUNTER — Ambulatory Visit
Admission: RE | Admit: 2024-04-07 | Discharge: 2024-04-07 | Disposition: A | Discharge status: Home / Self Care | Source: Ambulatory Visit | Attending: Family Medicine | Admitting: Family Medicine

## 2024-04-07 VITALS — BP 133/90 | HR 91 | Temp 98.2°F | Resp 18

## 2024-04-07 DIAGNOSIS — R002 Palpitations: Secondary | ICD-10-CM

## 2024-04-07 NOTE — Discharge Instructions (Addendum)
 Advised patient go to [facility] ED now for further evaluation of chest pain.
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
## 2024-04-07 NOTE — ED Triage Notes (Signed)
 Pt reports she has been palpitations x 2 hours. Chest achy last night. Reports feeling SOB and having chest aching x 2 hours.
